# Patient Record
Sex: Female | Born: 1950
Health system: Southern US, Community
[De-identification: ages and names within clinical notes are randomized; demographics above are authoritative.]

## PROBLEM LIST (undated history)

## (undated) DIAGNOSIS — I4891 Unspecified atrial fibrillation: Secondary | ICD-10-CM

## (undated) DIAGNOSIS — I420 Dilated cardiomyopathy: Secondary | ICD-10-CM

## (undated) DIAGNOSIS — I4819 Other persistent atrial fibrillation: Secondary | ICD-10-CM

## (undated) DIAGNOSIS — Z87891 Personal history of nicotine dependence: Secondary | ICD-10-CM

## (undated) HISTORY — DX: Unspecified atrial fibrillation: I48.91

## (undated) HISTORY — DX: Other persistent atrial fibrillation: I48.19

## (undated) HISTORY — DX: Dilated cardiomyopathy: I42.0

---

## 2019-09-28 ENCOUNTER — Emergency Department (HOSPITAL_COMMUNITY): Payer: Medicare Other

## 2019-09-28 ENCOUNTER — Inpatient Hospital Stay (HOSPITAL_COMMUNITY)
Admission: EM | Admit: 2019-09-28 | Discharge: 2019-10-03 | DRG: 287 | Disposition: A | Discharge status: Home / Self Care | Payer: Medicare Other | Attending: Cardiology | Admitting: Cardiology

## 2019-09-28 ENCOUNTER — Encounter (HOSPITAL_COMMUNITY): Payer: Self-pay | Admitting: Emergency Medicine

## 2019-09-28 ENCOUNTER — Other Ambulatory Visit: Payer: Self-pay

## 2019-09-28 DIAGNOSIS — R7303 Prediabetes: Secondary | ICD-10-CM | POA: Diagnosis present

## 2019-09-28 DIAGNOSIS — Z79899 Other long term (current) drug therapy: Secondary | ICD-10-CM | POA: Diagnosis not present

## 2019-09-28 DIAGNOSIS — I5043 Acute on chronic combined systolic (congestive) and diastolic (congestive) heart failure: Secondary | ICD-10-CM | POA: Diagnosis present

## 2019-09-28 DIAGNOSIS — I4891 Unspecified atrial fibrillation: Secondary | ICD-10-CM | POA: Diagnosis present

## 2019-09-28 DIAGNOSIS — Z87891 Personal history of nicotine dependence: Secondary | ICD-10-CM

## 2019-09-28 DIAGNOSIS — Z20828 Contact with and (suspected) exposure to other viral communicable diseases: Secondary | ICD-10-CM | POA: Diagnosis present

## 2019-09-28 DIAGNOSIS — E876 Hypokalemia: Secondary | ICD-10-CM

## 2019-09-28 DIAGNOSIS — E785 Hyperlipidemia, unspecified: Secondary | ICD-10-CM | POA: Diagnosis not present

## 2019-09-28 DIAGNOSIS — I361 Nonrheumatic tricuspid (valve) insufficiency: Secondary | ICD-10-CM | POA: Diagnosis not present

## 2019-09-28 DIAGNOSIS — E669 Obesity, unspecified: Secondary | ICD-10-CM | POA: Diagnosis present

## 2019-09-28 DIAGNOSIS — R030 Elevated blood-pressure reading, without diagnosis of hypertension: Secondary | ICD-10-CM | POA: Diagnosis not present

## 2019-09-28 DIAGNOSIS — R17 Unspecified jaundice: Secondary | ICD-10-CM | POA: Diagnosis not present

## 2019-09-28 DIAGNOSIS — Z23 Encounter for immunization: Secondary | ICD-10-CM

## 2019-09-28 DIAGNOSIS — R739 Hyperglycemia, unspecified: Secondary | ICD-10-CM | POA: Diagnosis not present

## 2019-09-28 DIAGNOSIS — I340 Nonrheumatic mitral (valve) insufficiency: Secondary | ICD-10-CM | POA: Diagnosis not present

## 2019-09-28 DIAGNOSIS — Z6832 Body mass index (BMI) 32.0-32.9, adult: Secondary | ICD-10-CM | POA: Diagnosis not present

## 2019-09-28 DIAGNOSIS — I5021 Acute systolic (congestive) heart failure: Secondary | ICD-10-CM | POA: Diagnosis not present

## 2019-09-28 DIAGNOSIS — I428 Other cardiomyopathies: Secondary | ICD-10-CM | POA: Diagnosis present

## 2019-09-28 DIAGNOSIS — I5041 Acute combined systolic (congestive) and diastolic (congestive) heart failure: Secondary | ICD-10-CM | POA: Diagnosis not present

## 2019-09-28 DIAGNOSIS — I110 Hypertensive heart disease with heart failure: Principal | ICD-10-CM | POA: Diagnosis present

## 2019-09-28 DIAGNOSIS — I509 Heart failure, unspecified: Secondary | ICD-10-CM

## 2019-09-28 HISTORY — DX: Personal history of nicotine dependence: Z87.891

## 2019-09-28 LAB — CBC WITH DIFFERENTIAL/PLATELET
Abs Immature Granulocytes: 0.04 10*3/uL (ref 0.00–0.07)
Basophils Absolute: 0 10*3/uL (ref 0.0–0.1)
Basophils Relative: 1 %
Eosinophils Absolute: 0 10*3/uL (ref 0.0–0.5)
Eosinophils Relative: 1 %
HCT: 46.5 % — ABNORMAL HIGH (ref 36.0–46.0)
Hemoglobin: 14.8 g/dL (ref 12.0–15.0)
Immature Granulocytes: 1 %
Lymphocytes Relative: 27 %
Lymphs Abs: 1.8 10*3/uL (ref 0.7–4.0)
MCH: 31.5 pg (ref 26.0–34.0)
MCHC: 31.8 g/dL (ref 30.0–36.0)
MCV: 98.9 fL (ref 80.0–100.0)
Monocytes Absolute: 0.8 10*3/uL (ref 0.1–1.0)
Monocytes Relative: 11 %
Neutro Abs: 4.2 10*3/uL (ref 1.7–7.7)
Neutrophils Relative %: 59 %
Platelets: 226 10*3/uL (ref 150–400)
RBC: 4.7 MIL/uL (ref 3.87–5.11)
RDW: 13.6 % (ref 11.5–15.5)
WBC: 6.9 10*3/uL (ref 4.0–10.5)
nRBC: 0 % (ref 0.0–0.2)

## 2019-09-28 LAB — URINALYSIS, ROUTINE W REFLEX MICROSCOPIC
Bilirubin Urine: NEGATIVE
Glucose, UA: NEGATIVE mg/dL
Ketones, ur: 5 mg/dL — AB
Leukocytes,Ua: NEGATIVE
Nitrite: NEGATIVE
Protein, ur: 30 mg/dL — AB
Specific Gravity, Urine: 1.014 (ref 1.005–1.030)
pH: 5 (ref 5.0–8.0)

## 2019-09-28 LAB — COMPREHENSIVE METABOLIC PANEL
ALT: 40 U/L (ref 0–44)
ALT: 38 U/L (ref 0–44)
AST: 33 U/L (ref 15–41)
AST: 33 U/L (ref 15–41)
Albumin: 4.1 g/dL (ref 3.5–5.0)
Albumin: 3.6 g/dL (ref 3.5–5.0)
Alkaline Phosphatase: 77 U/L (ref 38–126)
Alkaline Phosphatase: 68 U/L (ref 38–126)
Anion gap: 15 (ref 5–15)
Anion gap: 11 (ref 5–15)
BUN: 14 mg/dL (ref 8–23)
BUN: 14 mg/dL (ref 8–23)
CO2: 24 mmol/L (ref 22–32)
CO2: 27 mmol/L (ref 22–32)
Calcium: 9.1 mg/dL (ref 8.9–10.3)
Calcium: 8.8 mg/dL — ABNORMAL LOW (ref 8.9–10.3)
Chloride: 102 mmol/L (ref 98–111)
Chloride: 103 mmol/L (ref 98–111)
Creatinine, Ser: 0.92 mg/dL (ref 0.44–1.00)
Creatinine, Ser: 0.87 mg/dL (ref 0.44–1.00)
GFR calc Af Amer: >60 mL/min (ref >60)
GFR calc Af Amer: >60 mL/min (ref >60)
GFR calc non Af Amer: >60 mL/min (ref >60)
GFR calc non Af Amer: >60 mL/min (ref >60)
Glucose, Bld: 148 mg/dL — ABNORMAL HIGH (ref 70–99)
Glucose, Bld: 125 mg/dL — ABNORMAL HIGH (ref 70–99)
Potassium: 3 mmol/L — ABNORMAL LOW (ref 3.5–5.1)
Potassium: 3.6 mmol/L (ref 3.5–5.1)
Sodium: 141 mmol/L (ref 135–145)
Sodium: 141 mmol/L (ref 135–145)
Total Bilirubin: 1.4 mg/dL — ABNORMAL HIGH (ref 0.3–1.2)
Total Bilirubin: 1.4 mg/dL — ABNORMAL HIGH (ref 0.3–1.2)
Total Protein: 7.2 g/dL (ref 6.5–8.1)
Total Protein: 5.9 g/dL — ABNORMAL LOW (ref 6.5–8.1)

## 2019-09-28 LAB — PROTIME-INR
INR: 1 (ref 0.8–1.2)
Prothrombin Time: 13.2 seconds (ref 11.4–15.2)

## 2019-09-28 LAB — TSH: TSH: 1.459 u[IU]/mL (ref 0.350–4.500)

## 2019-09-28 LAB — TROPONIN I (HIGH SENSITIVITY)
Troponin I (High Sensitivity): 36 ng/L — ABNORMAL HIGH (ref <18)
Troponin I (High Sensitivity): 36 ng/L — ABNORMAL HIGH (ref <18)

## 2019-09-28 LAB — APTT: aPTT: 24 seconds (ref 24–36)

## 2019-09-28 LAB — BRAIN NATRIURETIC PEPTIDE: B Natriuretic Peptide: 1325.1 pg/mL — ABNORMAL HIGH (ref 0.0–100.0)

## 2019-09-28 LAB — SARS CORONAVIRUS 2 BY RT PCR (HOSPITAL ORDER, PERFORMED IN ~~LOC~~ HOSPITAL LAB): SARS Coronavirus 2: NEGATIVE

## 2019-09-28 MED ORDER — HEPARIN BOLUS VIA INFUSION
4000.0000 [IU] | Freq: Once | INTRAVENOUS | Status: AC
  Administered 2019-09-28: 4000 [IU] via INTRAVENOUS
  Filled 2019-09-28: qty 4000

## 2019-09-28 MED ORDER — POTASSIUM CHLORIDE 10 MEQ/100ML IV SOLN
10.0000 meq | Freq: Once | INTRAVENOUS | Status: AC
  Administered 2019-09-28: 10 meq via INTRAVENOUS
  Filled 2019-09-28: qty 100

## 2019-09-28 MED ORDER — POTASSIUM CHLORIDE CRYS ER 20 MEQ PO TBCR
20.0000 meq | EXTENDED_RELEASE_TABLET | Freq: Once | ORAL | Status: AC
  Administered 2019-09-28: 20 meq via ORAL
  Filled 2019-09-28: qty 1

## 2019-09-28 MED ORDER — HEPARIN (PORCINE) 25000 UT/250ML-% IV SOLN
1100.0000 [IU]/h | INTRAVENOUS | Status: DC
  Administered 2019-09-28 – 2019-09-30 (×2): 1100 [IU]/h via INTRAVENOUS
  Filled 2019-09-28 (×3): qty 250

## 2019-09-28 MED ORDER — FUROSEMIDE 10 MG/ML IJ SOLN
40.0000 mg | Freq: Every day | INTRAMUSCULAR | Status: DC
  Administered 2019-09-29 – 2019-09-30 (×2): 40 mg via INTRAVENOUS
  Filled 2019-09-28 (×2): qty 4

## 2019-09-28 MED ORDER — CAPTOPRIL 12.5 MG PO TABS
6.2500 mg | ORAL_TABLET | Freq: Three times a day (TID) | ORAL | Status: DC
  Administered 2019-09-28 – 2019-09-30 (×5): 6.25 mg via ORAL
  Filled 2019-09-28 (×7): qty 0.5

## 2019-09-28 MED ORDER — POTASSIUM CHLORIDE CRYS ER 20 MEQ PO TBCR
20.0000 meq | EXTENDED_RELEASE_TABLET | Freq: Every day | ORAL | Status: DC
  Administered 2019-09-29 – 2019-09-30 (×2): 20 meq via ORAL
  Filled 2019-09-28 (×2): qty 1

## 2019-09-28 MED ORDER — CARVEDILOL 6.25 MG PO TABS
6.2500 mg | ORAL_TABLET | Freq: Two times a day (BID) | ORAL | Status: DC
  Administered 2019-09-28 – 2019-09-30 (×4): 6.25 mg via ORAL
  Filled 2019-09-28 (×5): qty 1

## 2019-09-28 MED ORDER — FUROSEMIDE 10 MG/ML IJ SOLN
40.0000 mg | Freq: Once | INTRAMUSCULAR | Status: AC
  Administered 2019-09-28: 40 mg via INTRAVENOUS
  Filled 2019-09-28: qty 4

## 2019-09-28 NOTE — ED Notes (Signed)
Pt. Documented in error see above note in chart. 

## 2019-09-28 NOTE — H&P (Addendum)
History and Physical    Evelyn Phelps VVK:[id number] DOB: [birth year] DOA: 09/28/2019  PCP: Patient, No Pcp Per  Patient coming from: Home  I have personally briefly reviewed patient's old medical records in [facility]  Chief Complaint: Progressive shortness of breath  HPI: Evelyn Phelps is a 68 y.o. female with unknown medical history who presented with concerns of progressive shortness of breath.  Patient has not seen a primary care physician in many years.  She reports that for the past 2 weeks she has noticed gradual worsening shortness of breath worse with exertion and now to the point that she is dyspneic even when speaking.  She also noted increasing edema of her ankle and feet prompting her to present to the ED.  She denies any orthopnea or paroxysmal nocturnal dyspnea although she would get up to use the bathroom at night and would have trouble returning back to sleep due to her breathing.  She has been sleeping on a recliner but only secondary to her husband being a Paramedic and having questionable COVID contact.  She endorses tightness of her chest for the past week that she reports feels similar to her past pneumonias.  Denies any palpitation.  Denies any nausea, vomiting or diarrhea. She is a former smoker but quit in 2018.  Has occasional alcoholic drinks.  Denies any illicit drugs.  Denies any family history of any heart diseases.  ED Course: She had mildly elevated temperature of 99 and was hypertensive up to 180s over 80 on room air.  CBC showed no leukocytosis or anemia.  CMP showed low potassium of 3 and elevated glucose of 148.  Elevated total bilirubin of 1.4.  BNP of 1325.  Troponin of 36.  Chest x-ray showed cardiomegaly with edema.  There is also a very small bilateral pleural effusion.  Review of Systems: Constitutional: No Weight Change, No Fever ENT/Mouth: No sore throat, No Rhinorrhea Eyes: No Eye Pain, No Vision Changes Cardiovascular: + Chest Pain, + SOB, No  PND, No Dyspnea on Exertion, No Orthopnea + Edema, No Palpitations Respiratory: No Cough, No Sputum, No Wheezing, + Dyspnea  Gastrointestinal: No Nausea, No Vomiting, No Diarrhea, No Constipation, No Pain Genitourinary: no Urinary Incontinence, No Urgency, No Flank Pain Musculoskeletal: No Arthralgias, No Myalgias Skin: No Skin Lesions, No Pruritus, Neuro: no Weakness, No Numbness,  No Loss of Consciousness, No Syncope Psych: No Anxiety/Panic, No Depression, no decrease appetite Heme/Lymph: No Bruising, No Bleeding   History reviewed. No pertinent past medical history.  History reviewed. No pertinent surgical history.   reports that she has quit smoking. She has never used smokeless tobacco. She reports previous alcohol use. No history on file for drug.  Allergies  Allergen Reactions  . Erythromycin Rash    No family history on file. : Family history reviewed and not pertinent   Prior to Admission medications   Medication Sig Start Date End Date Taking? Authorizing Provider  ibuprofen (ADVIL) 200 MG tablet Take 200 mg by mouth daily as needed for moderate pain.   Yes [provider]    Physical Exam: Vitals:   09/28/19 1930 09/28/19 2000 09/28/19 2045 09/28/19 2100  BP: (!) 159/91 (!) 165/79  (!) 174/99  Pulse: 85 85    Resp:      Temp:      TempSrc:      SpO2: 95% 94% 95%   Weight:      Height:        Constitutional:  NAD, calm, comfortable, laying at 40 degree incline in bed Vitals:   09/28/19 1930 09/28/19 2000 09/28/19 2045 09/28/19 2100  BP: (!) 159/91 (!) 165/79  (!) 174/99  Pulse: 85 85    Resp:      Temp:      TempSrc:      SpO2: 95% 94% 95%   Weight:      Height:       Eyes: PERRL, lids and conjunctivae normal ENMT: Mucous membranes are moist.  Neck: normal, supple, no masses Respiratory: clear to auscultation bilaterally, no wheezing, no crackles. No accessory muscle use. Mild dyspnea with conversation. Cardiovascular: Regular rate and  rhythm, no murmurs / rubs / gallops. +3 distal pre-tibial and ankle edema of the lower extremities. 2+ pedal pulses.  Abdomen: no tenderness, no masses palpated. Bowel sounds positive.  GU: purwick catheter in place Musculoskeletal: no clubbing / cyanosis. No joint deformity upper and lower extremities. Good ROM, no contractures. Normal muscle tone.  Skin: no rashes, lesions, ulcers. No induration Neurologic: CN 2-12 grossly intact. Sensation intact.. Strength 5/5 in all 4.  Psychiatric: Normal judgment and insight. Alert and oriented x 3. Normal mood.     Labs on Admission: I have personally reviewed following labs and imaging studies  CBC: Recent Labs  Lab 09/28/19 1823  WBC 6.9  NEUTROABS 4.2  HGB 14.8  HCT 46.5*  MCV 98.9  PLT 675   Basic Metabolic Panel: Recent Labs  Lab 09/28/19 1823  NA 141  K 3.0*  CL 102  CO2 24  GLUCOSE 148*  BUN 14  CREATININE 0.92  CALCIUM 9.1   GFR: Estimated Creatinine Clearance: 65.9 mL/min (by C-G formula based on SCr of 0.92 mg/dL). Liver Function Tests: Recent Labs  Lab 09/28/19 1823  AST 33  ALT 40  ALKPHOS 77  BILITOT 1.4*  PROT 7.2  ALBUMIN 4.1   No results for input(s): LIPASE, AMYLASE in the last 168 hours. No results for input(s): AMMONIA in the last 168 hours. Coagulation Profile: No results for input(s): INR, PROTIME in the last 168 hours. Cardiac Enzymes: No results for input(s): CKTOTAL, CKMB, CKMBINDEX, TROPONINI in the last 168 hours. BNP (last 3 results) No results for input(s): PROBNP in the last 8760 hours. HbA1C: No results for input(s): HGBA1C in the last 72 hours. CBG: No results for input(s): GLUCAP in the last 168 hours. Lipid Profile: No results for input(s): CHOL, HDL, LDLCALC, TRIG, CHOLHDL, LDLDIRECT in the last 72 hours. Thyroid Function Tests: No results for input(s): TSH, T4TOTAL, FREET4, T3FREE, THYROIDAB in the last 72 hours. Anemia Panel: No results for input(s): VITAMINB12, FOLATE,  FERRITIN, TIBC, IRON, RETICCTPCT in the last 72 hours. Urine analysis:    Component Value Date/Time   COLORURINE YELLOW 09/28/2019 1916   APPEARANCEUR CLEAR 09/28/2019 1916   LABSPEC 1.014 09/28/2019 1916   PHURINE 5.0 09/28/2019 1916   GLUCOSEU NEGATIVE 09/28/2019 1916   HGBUR SMALL (A) 09/28/2019 [place] NEGATIVE 09/28/2019 1916   KETONESUR 5 (A) 09/28/2019 1916   PROTEINUR 30 (A) 09/28/2019 1916   NITRITE NEGATIVE 09/28/2019 1916   LEUKOCYTESUR NEGATIVE 09/28/2019 1916    Radiological Exams on Admission: Dg Chest 2 View  Result Date: 09/28/2019 CLINICAL DATA:  Increasing shortness of breath over the past 2 years, worse over the past week. EXAM: CHEST - 2 VIEW COMPARISON:  None. FINDINGS: There is cardiomegaly without edema. Very small bilateral pleural effusions and mild basilar atelectasis are seen. No pneumothorax. IMPRESSION: Cardiomegaly without  edema. Very small bilateral pleural effusions and mild basilar atelectasis. Electronically Signed   By: Drusilla Kanner M.D.   On: 09/28/2019 13:59    EKG: Independently reviewed.   Assessment/Plan   Acute Congestive heart failure -potentially acute on  chronic as she has been without medical care for many years -BNP of 1325 on admission.  Chest x-ray shows cardiomegaly and small bilateral pleural effusion. - Given IV 40mg  Lasix in the ED. Will continue 40 mg IV daily -Monitor I's and O's - Daily weights - echocardiogram - started on Coreg and captopril by EDP per cardiology recommendation. Discussed further with Dr. to start beta-blocker as pt is not decompensated.  - cardiology to evaluate in the morning  New onset atrial fibrillation with RVR - EKG showed atrial fibrillation with RVR with ventricular conduction delay - rate controlled around 110 on telemetry. PRN IV Metoprolol if rate becomes uncontrolled. - Likely triggered by acute CHF exacerbation -Continue monitor on telemetry -Started on Coreg  6.25 per cardiology - CH2AD2Vas score of 3 (age, gender, CHF) - start on Heparin gtt. Discussed Eliquis with patient and she will discuss further with cardiology in the morning - check TSH  Elevated troponin - likely secondary to demand ischemia from CHF and A.fib  - will trend x 2 to evaluate for any significant rise  Hypokalemia - K of 3 on admission - started on oral [place] and IV K x 2 in the ED - will continue oral K daily with daily diuresis  Hyperglycemia - 148 on admission - check HbA1C especially since she has been without medical care for many years  Hypertension - started on Captopril 6.25mg  and Coreg  - monitor BP   Elevated bilirubin -Mildly elevated at 1.4 admission -Most likely secondary to vascular congestion -Monitor with CMP in the morning    DVT prophylaxis: Heparin Code Status:Full Family Communication: Plan discussed with patient at bedside  disposition Plan: Home with at least 2 midnight stays  Consults called: Cardiology Admission status: inpatient  Amayiah Gosnell T Glory Graefe DO [facility]   If 7PM-7AM, please contact night-coverage [email or web address] Password [email or web address]  09/28/2019, 9:15 PM

## 2019-09-28 NOTE — ED Provider Notes (Signed)
[facility]-EMERGENCY DEPT Provider Note   CSN: [id number] Arrival date & time: 09/28/19  1254     History   Chief Complaint Chief Complaint  Patient presents with   Shortness of Breath   leg swelling    HPI Evelyn Phelps is a 68 y.o. female.     HPI   Evelyn Phelps is a 68 y.o. female, patient with no pertinent past medical history, presenting to the ED with shortness of breath with exertion worsening over the past 2 weeks. Intermittent, fleeting chest tightness.  She states the intensity of her shortness of breath has increased and the amount of activity required to cause shortness of breath has decreased. Began to have lower extremity edema about a week ago, first in the left ankle and foot and then in the right. She states she has had intermittent lower extremity edema in the past, but it typically resolves with elevation of the legs.  She has been sleeping on the sofa since her husband is a Paramedic and may have had cases of COVID among his coworkers.  She previously had a PCP, but he retired, and she has not seen a regular medical provider in about 10 years.  She did say, however, she had pneumonia 2 years ago and has had difficulty with premature fatigue since then.  Denies fever/chills, cough, N/V/C/D, abdominal pain, dizziness, syncope, urinary symptoms, sustained chest pain, back pain, or any other complaints.    History reviewed. No pertinent past medical history.  Patient Active Problem List   Diagnosis Date Noted   Acute CHF (congestive heart failure) (HCC) 09/28/2019    History reviewed. No pertinent surgical history.   OB History   No obstetric history on file.      Home Medications    Prior to Admission medications   Medication Sig Start Date End Date Taking? Authorizing Provider  ibuprofen (ADVIL) 200 MG tablet Take 200 mg by mouth daily as needed for moderate pain.   Yes [provider]    Family  History No family history on file.  Social History Social History   Tobacco Use   Smoking status: Former Smoker   Smokeless tobacco: Never Used  Substance Use Topics   Alcohol use: Not Currently   Drug use: Not on file     Allergies   Erythromycin   Review of Systems Review of Systems  Constitutional: Negative for chills, diaphoresis and fever.  Respiratory: Positive for chest tightness and shortness of breath. Negative for cough.   Cardiovascular: Positive for leg swelling. Negative for chest pain.  Gastrointestinal: Negative for abdominal pain, blood in stool, constipation, diarrhea, nausea and vomiting.  Genitourinary: Negative for dysuria, flank pain and hematuria.  Musculoskeletal: Negative for back pain.  Neurological: Negative for dizziness, syncope, light-headedness and numbness.  All other systems reviewed and are negative.    Physical Exam Updated Vital Signs BP (!) 184/83    Pulse (!) 115    Temp 99 F (37.2 C) (Oral)    Resp 20    Ht 5\' 6"  (1.676 m)    Wt 94.6 kg    SpO2 98%    BMI 33.65 kg/m   Physical Exam Vitals signs and nursing note reviewed.  Constitutional:      General: She is not in acute distress.    Appearance: She is well-developed. She is not diaphoretic.  HENT:     Head: Normocephalic and atraumatic.     Mouth/Throat:  Mouth: Mucous membranes are moist.     Pharynx: Oropharynx is clear.  Eyes:     Conjunctiva/sclera: Conjunctivae normal.  Neck:     Musculoskeletal: Neck supple.  Cardiovascular:     Rate and Rhythm: Tachycardia present. Rhythm irregularly irregular.     Pulses: Normal pulses.          Radial pulses are 2+ on the right side and 2+ on the left side.       Posterior tibial pulses are 2+ on the right side and 2+ on the left side.     Heart sounds: Normal heart sounds.     Comments: Tactile temperature in the extremities appropriate and equal bilaterally. Patient initially mildly tachycardic. Pulmonary:      Effort: Pulmonary effort is normal. No respiratory distress.     Breath sounds: Normal breath sounds.     Comments: Increased work of breathing and voices increased difficulty breathing with small amounts of exertion, such as putting on socks. Abdominal:     Palpations: Abdomen is soft.     Tenderness: There is no abdominal tenderness. There is no guarding.  Musculoskeletal:     Right lower leg: Edema present.     Left lower leg: Edema present.     Comments: Bilateral LE pitting edema. No tenderness, increased warmth, or color change.   Lymphadenopathy:     Cervical: No cervical adenopathy.  Skin:    General: Skin is warm and dry.  Neurological:     Mental Status: She is alert.  Psychiatric:        Mood and Affect: Mood and affect normal.        Speech: Speech normal.        Behavior: Behavior normal.      ED Treatments / Results  Labs (all labs ordered are listed, but only abnormal results are displayed) Labs Reviewed  COMPREHENSIVE METABOLIC PANEL - Abnormal; Notable for the following components:      Result Value   Potassium 3.0 (*)    Glucose, Bld 148 (*)    Total Bilirubin 1.4 (*)    All other components within normal limits  URINALYSIS, ROUTINE W REFLEX MICROSCOPIC - Abnormal; Notable for the following components:   Hgb urine dipstick SMALL (*)    Ketones, ur 5 (*)    Protein, ur 30 (*)    Bacteria, UA RARE (*)    All other components within normal limits  CBC WITH DIFFERENTIAL/PLATELET - Abnormal; Notable for the following components:   HCT 46.5 (*)    All other components within normal limits  BRAIN NATRIURETIC PEPTIDE - Abnormal; Notable for the following components:   B Natriuretic Peptide 1,325.1 (*)    All other components within normal limits  TROPONIN I (HIGH SENSITIVITY) - Abnormal; Notable for the following components:   Troponin I (High Sensitivity) 36 (*)    All other components within normal limits  SARS CORONAVIRUS 2 BY RT PCR (HOSPITAL ORDER,  PERFORMED IN [facility])  PROTIME-INR  APTT  CBC  HIV ANTIBODY (ROUTINE TESTING W REFLEX)  HIV4GL SAVE TUBE  HEMOGLOBIN A1C  COMPREHENSIVE METABOLIC PANEL  TSH  TROPONIN I (HIGH SENSITIVITY)    EKG EKG Interpretation  Date/Time:  Sunday September 28 2019 13:09:58 EDT Ventricular Rate:  118 PR Interval:    QRS Duration: 154 QT Interval:  342 QTC Calculation: 479 R Axis:   -99 Text Interpretation:  Atrial fibrillation with rapid ventricular response with premature ventricular or aberrantly conducted  complexes Right superior axis deviation Non-specific intra-ventricular conduction block Abnormal ECG No prior for comparison.  Confirmed by Alona Bene [phone]) on 09/28/2019 7:12:23 PM   Radiology Dg Chest 2 View  Result Date: 09/28/2019 CLINICAL DATA:  Increasing shortness of breath over the past 2 years, worse over the past week. EXAM: CHEST - 2 VIEW COMPARISON:  None. FINDINGS: There is cardiomegaly without edema. Very small bilateral pleural effusions and mild basilar atelectasis are seen. No pneumothorax. IMPRESSION: Cardiomegaly without edema. Very small bilateral pleural effusions and mild basilar atelectasis. Electronically Signed   By: Drusilla Kanner M.D.   On: 09/28/2019 13:59    Procedures .Critical Care Performed by: Anselm Pancoast, PA-C Authorized by: Anselm Pancoast, PA-C   Critical care provider statement:    Critical care time (minutes):  35   Critical care was necessary to treat or prevent imminent or life-threatening deterioration of the following conditions:  Cardiac failure   Critical care was time spent personally by me on the following activities:  Development of treatment plan with patient or surrogate, discussions with consultants, evaluation of patient's response to treatment, examination of patient, obtaining history from patient or surrogate, ordering and performing treatments and interventions, ordering and review of laboratory studies, ordering  and review of radiographic studies, pulse oximetry and re-evaluation of patient's condition   I assumed direction of critical care for this patient from another provider in my specialty: no     (including critical care time)  Medications Ordered in ED Medications  potassium chloride 10 mEq in 100 mL IVPB (has no administration in time range)    Followed by  potassium chloride 10 mEq in 100 mL IVPB (has no administration in time range)  furosemide (LASIX) injection 40 mg (has no administration in time range)  carvedilol (COREG) tablet 6.25 mg (6.25 mg Oral Given 09/28/19 2129)  captopril (CAPOTEN) tablet 6.25 mg (has no administration in time range)  heparin bolus via infusion 4,000 Units (has no administration in time range)  heparin ADULT infusion 100 units/mL (25000 units/232mL sodium chloride 0.45%) (has no administration in time range)  furosemide (LASIX) injection 40 mg (has no administration in time range)  potassium chloride SA (KLOR-CON) CR tablet 20 mEq (has no administration in time range)  potassium chloride SA (KLOR-CON) CR tablet 20 mEq (20 mEq Oral Given 09/28/19 2116)   This patients CHA2DS2-VASc Score and unadjusted Ischemic Stroke Rate (% per year) is equal to 4.8 % stroke rate/year from a score of 4  Above score calculated as 1 point each if present [CHF, HTN, DM, Vascular=MI/PAD/Aortic Plaque, Age if 65-74, or Female] Above score calculated as 2 points each if present [Age > 75, or Stroke/TIA/TE]   Initial Impression / Assessment and Plan / ED Course  I have reviewed the triage vital signs and the nursing notes.  Pertinent labs & imaging results that were available during my care of the patient were reviewed by me and considered in my medical decision making (see chart for details).  Clinical Course as of Sep 27 2138  Wynelle Link Sep 28, 2019  2016 Patient has no additional symptoms.  We discussed lab findings and plan for admission.  Patient agrees to the plan. Although  the monitor is reading tachycardic pulse rate for the patient, her manual pulse was noted to be 90, irregularly irregular.   [SJ]  2032 Spoke with Dr. Okey Dupre, cardiology fellow.  Agrees with overall picture suggesting new onset/exacerbation CHF and EKG suggesting A. Fib. Agrees with  starting heparin for the patient's A. Fib and the 40 mg Lasix.  We discussed the hypokalemia and proposed supplementation. Recommends starting carvedilol 6.25 mg twice daily and captopril 6.25 mg every 8 hours. He states she can get an echo here in the morning.   [SJ]  2040 Spoke with Dr. Flossie Buffy, hospitalist. Agrees to admit the patient.   [SJ]    Clinical Course User Index [SJ] Maleiyah Releford C, PA-C       Patient presents with progressively worsening shortness of breath as well as lower extremity edema.  Overall picture suggests possible new onset CHF with fluid overload. Her pulse rate was mildly tachycardic, but improved with rest.  Noted to be in A. fib.  Hypertensive, but low suspicion for hypertensive emergency. BNP elevated.  Suspect mildly elevated troponin is due to demand from fluid overload. Hypokalemia noted and addressed. Patient admitted to hospitalist with cardiology consulting.  Findings and plan of care discussed with Gara Kroner, MD. Dr. Laverta Baltimore personally evaluated and examined this patient.  Vitals:   09/28/19 2045 09/28/19 2100 09/28/19 2127 09/28/19 2128  BP:  (!) 174/99 (!) 161/91   Pulse:   (!) 106   Resp:    (!) 22  Temp:      TempSrc:      SpO2: 95%  92%   Weight:      Height:         Final Clinical Impressions(s) / ED Diagnoses   Final diagnoses:  New onset of congestive heart failure [facility])    ED Discharge Orders    None       Layla Maw 09/28/19 2139    Margette Fast, MD 09/29/19 1409

## 2019-09-28 NOTE — Progress Notes (Signed)
ANTICOAGULATION CONSULT NOTE - Initial Consult  Pharmacy Consult for heparin Indication: atrial fibrillation  Allergies  Allergen Reactions  . Erythromycin Rash    Patient Measurements: Height: 5\' 6"  (167.6 cm) Weight: 208 lb 8 oz (94.6 kg) IBW/kg (Calculated) : 59.3 Heparin Dosing Weight: 80 kg  Vital Signs: Temp: 99 F (37.2 C) (10/11 1309) Temp Source: Oral (10/11 1309) BP: 165/79 (10/11 2000) Pulse Rate: 85 (10/11 2000)  Labs: Recent Labs    09/28/19 1823  HGB 14.8  HCT 46.5*  PLT 226  CREATININE 0.92  TROPONINIHS 36*    Estimated Creatinine Clearance: 65.9 mL/min (by C-G formula based on SCr of 0.92 mg/dL).   Medical History: History reviewed. No pertinent past medical history.  Assessment: Pharmacy consulted to dose/monitor heparin drip for this 68 year old female for atrial fibrillation. Pt not on anticoagulants PTA, does not follow with any PCP.   Baseline labs:  -Hgb 14.8 -Plt 226  -aPTT and protime/INR ordered  Goal of Therapy:  Heparin level 0.3-0.7 units/ml Monitor platelets by anticoagulation protocol: Yes   Plan:  Give 4000 units bolus x 1 Start heparin infusion at 1100 units/hr Check anti-Xa level in 8 hours and daily while on heparin Continue to monitor H&H and platelets  Lenis Noon, PharmD 09/28/2019,8:52 PM

## 2019-09-28 NOTE — ED Notes (Signed)
Pt given warm blanket while waiting in lobby for an available bed. Pt asking how many doctors we have here because she has seen several EMS come and go and if she would have called an ambulance would she have been seen faster? This RN informed pt that just because people come by ambulance doesn't guarantee you a bed, it depends on severity of patient's symptoms.  Pt then states that "well how much longer is it going to be because when I checked in they told me at the desk it would be an hour before I get in the back and my husband is in the car and has to work tonight and I don't know what to tell him". Informed patient that I can't tell her how much longer it will be before she gets a room in the back, as we are holding several admission patients that are tying up a lot of our rooms and until we are able to get them out, we don't have rooms available.

## 2019-09-28 NOTE — ED Triage Notes (Signed)
Pt c/o SOB since last week esp with walking in grocery store. And since Friday having bilat feet swelling

## 2019-09-29 ENCOUNTER — Inpatient Hospital Stay (HOSPITAL_COMMUNITY): Payer: Medicare Other

## 2019-09-29 DIAGNOSIS — I361 Nonrheumatic tricuspid (valve) insufficiency: Secondary | ICD-10-CM

## 2019-09-29 DIAGNOSIS — I340 Nonrheumatic mitral (valve) insufficiency: Secondary | ICD-10-CM

## 2019-09-29 LAB — BASIC METABOLIC PANEL
Anion gap: 16 — ABNORMAL HIGH (ref 5–15)
BUN: 13 mg/dL (ref 8–23)
CO2: 22 mmol/L (ref 22–32)
Calcium: 8.6 mg/dL — ABNORMAL LOW (ref 8.9–10.3)
Chloride: 103 mmol/L (ref 98–111)
Creatinine, Ser: 0.88 mg/dL (ref 0.44–1.00)
GFR calc Af Amer: >60 mL/min (ref >60)
GFR calc non Af Amer: >60 mL/min (ref >60)
Glucose, Bld: 122 mg/dL — ABNORMAL HIGH (ref 70–99)
Potassium: 3.4 mmol/L — ABNORMAL LOW (ref 3.5–5.1)
Sodium: 141 mmol/L (ref 135–145)

## 2019-09-29 LAB — CBC
HCT: 39.4 % (ref 36.0–46.0)
Hemoglobin: 12.6 g/dL (ref 12.0–15.0)
MCH: 32 pg (ref 26.0–34.0)
MCHC: 32 g/dL (ref 30.0–36.0)
MCV: 100 fL (ref 80.0–100.0)
Platelets: 179 10*3/uL (ref 150–400)
RBC: 3.94 MIL/uL (ref 3.87–5.11)
RDW: 13.6 % (ref 11.5–15.5)
WBC: 6.3 10*3/uL (ref 4.0–10.5)
nRBC: 0 % (ref 0.0–0.2)

## 2019-09-29 LAB — TROPONIN I (HIGH SENSITIVITY): Troponin I (High Sensitivity): 30 ng/L — ABNORMAL HIGH (ref <18)

## 2019-09-29 LAB — ECHOCARDIOGRAM COMPLETE
Height: 66 in
Weight: 3262.81 oz

## 2019-09-29 LAB — GLUCOSE, CAPILLARY
Glucose-Capillary: 132 mg/dL — ABNORMAL HIGH (ref 70–99)
Glucose-Capillary: 114 mg/dL — ABNORMAL HIGH (ref 70–99)

## 2019-09-29 LAB — HEMOGLOBIN A1C
Hgb A1c MFr Bld: 6 % — ABNORMAL HIGH (ref 4.8–5.6)
Mean Plasma Glucose: 125.5 mg/dL

## 2019-09-29 LAB — HEPARIN LEVEL (UNFRACTIONATED)
Heparin Unfractionated: 0.55 IU/mL (ref 0.30–0.70)
Heparin Unfractionated: 0.61 IU/mL (ref 0.30–0.70)

## 2019-09-29 MED ORDER — INSULIN ASPART 100 UNIT/ML ~~LOC~~ SOLN
0.0000 [IU] | Freq: Three times a day (TID) | SUBCUTANEOUS | Status: DC
  Administered 2019-09-29 – 2019-10-03 (×4): 1 [IU] via SUBCUTANEOUS

## 2019-09-29 MED ORDER — INFLUENZA VAC A&B SA ADJ QUAD 0.5 ML IM PRSY
0.5000 mL | PREFILLED_SYRINGE | INTRAMUSCULAR | Status: AC
  Administered 2019-09-30: 0.5 mL via INTRAMUSCULAR
  Filled 2019-09-29: qty 0.5

## 2019-09-29 NOTE — Progress Notes (Signed)
PROGRESS NOTE  MENDE BISWELL FWY:[id number] DOB: April 11, 1951 DOA: 09/28/2019 PCP: Patient, No Pcp Per  HPI/Recap of past 24 hours: Evelyn Phelps is a 68 y.o. female with unknown medical history who presented with concerns of progressive shortness of breath.  Patient has not seen a primary care physician in many years.  She reports that for the past 2 weeks she has noticed gradual worsening shortness of breath worse with exertion and now to the point that she is dyspneic even when speaking.  She also noted increasing edema of her ankle and feet prompting her to present to the ED.  She denies any orthopnea or paroxysmal nocturnal dyspnea although she would get up to use the bathroom at night and would have trouble returning back to sleep due to her breathing.  She has been sleeping on a recliner but only secondary to her husband being a Tour manager and having questionable COVID contact.  She endorses tightness of her chest for the past week that she reports feels similar to her past pneumonias.  Denies any palpitation.  Denies any nausea, vomiting or diarrhea. She is a former smoker but quit in 2018.  Has occasional alcoholic drinks.  Denies any illicit drugs.  Denies any family history of any heart diseases.  ED Course: She had mildly elevated temperature of 99 and was hypertensive up to 180s over 80 on room air.  CBC showed no leukocytosis or anemia.  CMP showed low potassium of 3 and elevated glucose of 148.  Elevated total bilirubin of 1.4.  BNP of 1325.  Troponin of 36.  Chest x-ray showed cardiomegaly with edema.  There is also a very small bilateral pleural effusion.  09/29/19: Patient was seen and examined at her bedside this morning.  She states she still has trouble laying down flat without feeling short of breath.  Denies any chest pain or palpitations.  Denies family history of heart disease.   Assessment/Plan: Active Problems:   Acute CHF (congestive heart failure) ([place])  Newly  diagnosed acute systolic CHF Presented with bilateral lower extremity edema, conversational dyspnea, PND, BNP greater than 1300, mildly elevated troponin, and chest x-ray showing cardiomegaly with pulmonary edema and bilateral pleural effusions. 2D echo done on 10/07/2019 showed LVEF 25 to 30% with severely decreased function of left ventricle and severely increased left ventricular size.  A kinesis of the anteroseptal, apical and inferior walls. Started on IV Lasix with KCl replacement, Coreg and captopril. Cardiology following Continue strict I's and O's and daily weight Net I&O -2.9 L Start daily BMPs and closely monitor blood pressure, electrolytes, urine output, and renal function while on IV diuretics  Newly diagnosed A. fib with RVR Presented with A. fib RVR TSH normal Started on Coreg with improvement of rate Chads Vascor 3, on heparin drip Continue heparin drip if procedure planned Defer to cardiology to switch to NOAC  Elevated troponin S likely demand ischemia from A. fib RVR versus acute systolic CHF Troponin S peaked at 36 and trended down No anginal symptoms  Impaired glucose tolerance with hyperglycemia Hemoglobin A1c 6.0 on 09/29/2019 Start insulin sliding scale  Obesity BMI 32 Recommend weight loss outpatient with regular physical activity and healthy dieting when stable.    DVT prophylaxis: Heparin drip Code Status:Full Family Communication: None at bedside.     Disposition Plan: The patient is currently not appropriate for discharge at this time due to ongoing work-up for newly diagnosed systolic CHF and newly diagnosed A. fib.  Consults called: Cardiology      Objective: Vitals:   09/29/19 0030 09/29/19 0100 09/29/19 0145 09/29/19 0655  BP: 135/77 (!) 143/78 (!) 145/90   Pulse:  78 87   Resp: (!) 28 (!) 26 18   Temp:   98.3 F (36.8 C)   TempSrc:   Oral   SpO2:  92% 95%   Weight:    92.5 kg  Height:        Intake/Output Summary (Last 24  hours) at 09/29/2019 1329 Last data filed at 09/29/2019 1105 Gross per 24 hour  Intake 296.28 ml  Output 3250 ml  Net -2953.72 ml   Filed Weights   09/28/19 1313 09/29/19 0655  Weight: 94.6 kg 92.5 kg    Exam:  . General: 68 y.o. year-old female well developed well nourished in no acute distress.  Alert and oriented x4. . Cardiovascular: Irregular rate and rhythm with no rubs or gallops.  No thyromegaly or JVD noted.   Marland Kitchen Respiratory: Clear to auscultation with no wheezes or rales. Good inspiratory effort. . Abdomen: Soft nontender nondistended with normal bowel sounds x4 quadrants. . Musculoskeletal: Trace lower extremity edema. 2/4 pulses in all 4 extremities. Marland Kitchen Psychiatry: Mood is appropriate for condition and setting   Data Reviewed: CBC: Recent Labs  Lab 09/28/19 1823 09/29/19 0516  WBC 6.9 6.3  NEUTROABS 4.2  --   HGB 14.8 12.6  HCT 46.5* 39.4  MCV 98.9 100.0  PLT 226 179   Basic Metabolic Panel: Recent Labs  Lab 09/28/19 1823 09/28/19 2131 09/29/19 0650  NA 141 141 141  K 3.0* 3.6 3.4*  CL 102 103 103  CO2 24 27 22   GLUCOSE 148* 125* 122*  BUN 14 14 13   CREATININE 0.92 0.87 0.88  CALCIUM 9.1 8.8* 8.6*   GFR: Estimated Creatinine Clearance: 68.2 mL/min (by C-G formula based on SCr of 0.88 mg/dL). Liver Function Tests: Recent Labs  Lab 09/28/19 1823 09/28/19 2131  AST 33 33  ALT 40 38  ALKPHOS 77 68  BILITOT 1.4* 1.4*  PROT 7.2 5.9*  ALBUMIN 4.1 3.6   No results for input(s): LIPASE, AMYLASE in the last 168 hours. No results for input(s): AMMONIA in the last 168 hours. Coagulation Profile: Recent Labs  Lab 09/28/19 2106  INR 1.0   Cardiac Enzymes: No results for input(s): CKTOTAL, CKMB, CKMBINDEX, TROPONINI in the last 168 hours. BNP (last 3 results) No results for input(s): PROBNP in the last 8760 hours. HbA1C: Recent Labs    09/29/19 0209  HGBA1C 6.0*   CBG: No results for input(s): GLUCAP in the last 168 hours. Lipid Profile:  No results for input(s): CHOL, HDL, LDLCALC, TRIG, CHOLHDL, LDLDIRECT in the last 72 hours. Thyroid Function Tests: Recent Labs    09/28/19 2132  TSH 1.459   Anemia Panel: No results for input(s): VITAMINB12, FOLATE, FERRITIN, TIBC, IRON, RETICCTPCT in the last 72 hours. Urine analysis:    Component Value Date/Time   COLORURINE YELLOW 09/28/2019 1916   APPEARANCEUR CLEAR 09/28/2019 1916   LABSPEC 1.014 09/28/2019 1916   PHURINE 5.0 09/28/2019 1916   GLUCOSEU NEGATIVE 09/28/2019 1916   HGBUR SMALL (A) 09/28/2019 1916   BILIRUBINUR NEGATIVE 09/28/2019 1916   KETONESUR 5 (A) 09/28/2019 1916   PROTEINUR 30 (A) 09/28/2019 1916   NITRITE NEGATIVE 09/28/2019 1916   LEUKOCYTESUR NEGATIVE 09/28/2019 1916   Sepsis Labs: @LABRCNTIP (procalcitonin:4,lacticidven:4)  ) Recent Results (from the past 240 hour(s))  SARS Coronavirus 2 by RT PCR (hospital order, performed  [facility]) Nasopharyngeal Nasopharyngeal Swab     Status: None   Collection Time: 09/28/19  7:40 PM   Specimen: Nasopharyngeal Swab  Result Value Ref Range Status   SARS Coronavirus 2 NEGATIVE NEGATIVE Final    Comment: (NOTE) If result is NEGATIVE SARS-CoV-2 target nucleic acids are NOT DETECTED. The SARS-CoV-2 RNA is generally detectable in upper and lower  respiratory specimens during the acute phase of infection. The lowest  concentration of SARS-CoV-2 viral copies this assay can detect is 250  copies / mL. A negative result does not preclude SARS-CoV-2 infection  and should not be used as the sole basis for treatment or other  patient management decisions.  A negative result may occur with  improper specimen collection / handling, submission of specimen other  than nasopharyngeal swab, presence of viral mutation(s) within the  areas targeted by this assay, and inadequate number of viral copies  (<250 copies / mL). A negative result must be combined with clinical  observations, patient history, and  epidemiological information. If result is POSITIVE SARS-CoV-2 target nucleic acids are DETECTED. The SARS-CoV-2 RNA is generally detectable in upper and lower  respiratory specimens dur ing the acute phase of infection.  Positive  results are indicative of active infection with SARS-CoV-2.  Clinical  correlation with patient history and other diagnostic information is  necessary to determine patient infection status.  Positive results do  not rule out bacterial infection or co-infection with other viruses. If result is PRESUMPTIVE POSTIVE SARS-CoV-2 nucleic acids MAY BE PRESENT.   A presumptive positive result was obtained on the submitted specimen  and confirmed on repeat testing.  While 2019 novel coronavirus  (SARS-CoV-2) nucleic acids may be present in the submitted sample  additional confirmatory testing may be necessary for epidemiological  and / or clinical management purposes  to differentiate between  SARS-CoV-2 and other Sarbecovirus currently known to infect humans.  If clinically indicated additional testing with an alternate test  methodology [phone]) is advised. The SARS-CoV-2 RNA is generally  detectable in upper and lower respiratory sp ecimens during the acute  phase of infection. The expected result is Negative. Fact Sheet for Patients:  BoilerBrush.com.cyhttps://www.fda.gov/media/136312/download Fact Sheet for Healthcare Providers: https://pope.com/https://www.fda.gov/media/136313/download This test is not yet approved or cleared by the [place] FDA and has been authorized for detection and/or diagnosis of SARS-CoV-2 by FDA under an Emergency Use Authorization (EUA).  This EUA will remain in effect (meaning this test can be used) for the duration of the COVID-19 declaration under Section 564(b)(1) of the Act, 21 U.S.C. section 360bbb-3(b)(1), unless the authorization is terminated or revoked sooner. Performed at [facility], [address] [place], [place] [address]        Studies: Dg Chest 2 View  Result Date: 09/28/2019 CLINICAL DATA:  Increasing shortness of breath over the past 2 years, worse over the past week. EXAM: CHEST - 2 VIEW COMPARISON:  None. FINDINGS: There is cardiomegaly without edema. Very small bilateral pleural effusions and mild basilar atelectasis are seen. No pneumothorax. IMPRESSION: Cardiomegaly without edema. Very small bilateral pleural effusions and mild basilar atelectasis. Electronically Signed   By: Drusilla Kannerhomas  Dalessio M.D.   On: 09/28/2019 13:59    Scheduled Meds: . captopril  6.25 mg Oral Q8H  . carvedilol  6.25 mg Oral BID WC  . furosemide  40 mg Intravenous Daily  . [START ON 09/30/2019] influenza vaccine adjuvanted  0.5 mL Intramuscular Tomorrow-1000  . potassium chloride  20  mEq Oral Daily    Continuous Infusions: . heparin 1,100 Units/hr (09/29/19 0153)     LOS: 1 day     Darlin Drop, MD [facility] Pager [phone]  If 7PM-7AM, please contact night-coverage [email or web address] Password [email or web address] 09/29/2019, 1:29 PM

## 2019-09-29 NOTE — Progress Notes (Addendum)
ANTICOAGULATION CONSULT NOTE - Initial Consult  Pharmacy Consult for heparin Indication: atrial fibrillation  Allergies  Allergen Reactions  . Erythromycin Rash    Patient Measurements: Height: 5\' 6"  (167.6 cm) Weight: 203 lb 14.8 oz (92.5 kg) IBW/kg (Calculated) : 59.3 Heparin Dosing Weight: 80 kg  Vital Signs: Temp: 98.3 F (36.8 C) (10/12 0145) Temp Source: Oral (10/12 0145) BP: 145/90 (10/12 0145) Pulse Rate: 87 (10/12 0145)  Labs: Recent Labs    09/28/19 1823 09/28/19 1950 09/28/19 2106 09/28/19 2131 09/29/19 0209 09/29/19 0516 09/29/19 0650  HGB 14.8  --   --   --   --  12.6  --   HCT 46.5*  --   --   --   --  39.4  --   PLT 226  --   --   --   --  179  --   APTT  --   --  24  --   --   --   --   LABPROT  --   --  13.2  --   --   --   --   INR  --   --  1.0  --   --   --   --   HEPARINUNFRC  --   --   --   --   --  0.55  --   CREATININE 0.92  --   --  0.87  --   --  0.88  TROPONINIHS 36* 36*  --   --  30*  --   --     Estimated Creatinine Clearance: 68.2 mL/min (by C-G formula based on SCr of 0.88 mg/dL).   Medical History: History reviewed. No pertinent past medical history.  Assessment: Pharmacy consulted to dose/monitor heparin drip for this 68 year old female for atrial fibrillation. Pt not on anticoagulants PTA, does not follow with any PCP.   Today, 09/29/2019  Heparin level 0.55, WNL  CBC: Hgb and Plts WNL (note plts dropped from 226 to 179 in 12 hours since starting heparin). Potentially a dilution effect, time frame does not match for Type II HIT. Could be transient decrease (Type I HIT). Will f/u levels  No bleeding or infusion related issues reported by nurse.  Goal of Therapy:  Heparin level 0.3-0.7 units/ml Monitor platelets by anticoagulation protocol: Yes   Plan:  Continue heparin infusion rate of 1100 units/hr Check heparin level at 1400 (confirmatory) Monitor daily CBC and heparin level while on heparin Monitor for s/sx of  bleeding F/u transition to [place], PharmD Candidate 09/29/2019,8:05 AM

## 2019-09-29 NOTE — Progress Notes (Signed)
  Echocardiogram 2D Echocardiogram has been performed.  Jannett Celestine 09/29/2019, 9:47 AM

## 2019-09-30 ENCOUNTER — Encounter (HOSPITAL_COMMUNITY): Payer: Self-pay

## 2019-09-30 DIAGNOSIS — I5041 Acute combined systolic (congestive) and diastolic (congestive) heart failure: Secondary | ICD-10-CM

## 2019-09-30 DIAGNOSIS — I4891 Unspecified atrial fibrillation: Secondary | ICD-10-CM

## 2019-09-30 LAB — MAGNESIUM: Magnesium: 1.8 mg/dL (ref 1.7–2.4)

## 2019-09-30 LAB — BASIC METABOLIC PANEL
Anion gap: 12 (ref 5–15)
BUN: 21 mg/dL (ref 8–23)
CO2: 29 mmol/L (ref 22–32)
Calcium: 8.4 mg/dL — ABNORMAL LOW (ref 8.9–10.3)
Chloride: 101 mmol/L (ref 98–111)
Creatinine, Ser: 0.86 mg/dL (ref 0.44–1.00)
GFR calc Af Amer: >60 mL/min (ref >60)
GFR calc non Af Amer: >60 mL/min (ref >60)
Glucose, Bld: 103 mg/dL — ABNORMAL HIGH (ref 70–99)
Potassium: 3.2 mmol/L — ABNORMAL LOW (ref 3.5–5.1)
Sodium: 142 mmol/L (ref 135–145)

## 2019-09-30 LAB — HEPARIN LEVEL (UNFRACTIONATED): Heparin Unfractionated: 0.45 IU/mL (ref 0.30–0.70)

## 2019-09-30 LAB — HIV ANTIBODY (ROUTINE TESTING W REFLEX): HIV Screen 4th Generation wRfx: NONREACTIVE

## 2019-09-30 LAB — GLUCOSE, CAPILLARY
Glucose-Capillary: 108 mg/dL — ABNORMAL HIGH (ref 70–99)
Glucose-Capillary: 111 mg/dL — ABNORMAL HIGH (ref 70–99)
Glucose-Capillary: 125 mg/dL — ABNORMAL HIGH (ref 70–99)
Glucose-Capillary: 104 mg/dL — ABNORMAL HIGH (ref 70–99)

## 2019-09-30 LAB — CBC
HCT: 42.5 % (ref 36.0–46.0)
Hemoglobin: 13.6 g/dL (ref 12.0–15.0)
MCH: 32.1 pg (ref 26.0–34.0)
MCHC: 32 g/dL (ref 30.0–36.0)
MCV: 100.2 fL — ABNORMAL HIGH (ref 80.0–100.0)
Platelets: 184 10*3/uL (ref 150–400)
RBC: 4.24 MIL/uL (ref 3.87–5.11)
RDW: 13.8 % (ref 11.5–15.5)
WBC: 5.7 10*3/uL (ref 4.0–10.5)
nRBC: 0 % (ref 0.0–0.2)

## 2019-09-30 LAB — LIPID PANEL
Cholesterol: 172 mg/dL (ref 0–200)
HDL: 42 mg/dL (ref >40)
LDL Cholesterol: 113 mg/dL — ABNORMAL HIGH (ref 0–99)
Total CHOL/HDL Ratio: 4.1 RATIO
Triglycerides: 83 mg/dL (ref <150)
VLDL: 17 mg/dL (ref 0–40)

## 2019-09-30 LAB — POTASSIUM: Potassium: 3.9 mmol/L (ref 3.5–5.1)

## 2019-09-30 MED ORDER — CARVEDILOL 12.5 MG PO TABS
12.5000 mg | ORAL_TABLET | Freq: Two times a day (BID) | ORAL | Status: DC
  Administered 2019-09-30 – 2019-10-03 (×5): 12.5 mg via ORAL
  Filled 2019-09-30 (×5): qty 1

## 2019-09-30 MED ORDER — FUROSEMIDE 40 MG PO TABS: 40.0000 mg | ORAL_TABLET | Freq: Every day | ORAL | Status: DC

## 2019-09-30 MED ORDER — POTASSIUM CHLORIDE CRYS ER 20 MEQ PO TBCR
40.0000 meq | EXTENDED_RELEASE_TABLET | Freq: Two times a day (BID) | ORAL | Status: AC
  Administered 2019-09-30 (×2): 40 meq via ORAL
  Filled 2019-09-30 (×2): qty 2

## 2019-09-30 NOTE — Plan of Care (Signed)
  Problem: Nutrition: Goal: Adequate nutrition will be maintained Outcome: Progressing   Problem: Pain Managment: Goal: General experience of comfort will improve Outcome: Progressing   Problem: Safety: Goal: Ability to remain free from injury will improve Outcome: Progressing   

## 2019-09-30 NOTE — Consult Note (Signed)
Cardiology Consultation:   Patient ID: Evelyn Phelps; [id number]; [birth year]   Admit date: 09/28/2019 Date of Consult: 09/30/2019  Primary Care Provider: Patient, No Pcp Per Primary Cardiologist: [facility]; Dr. Cristal Deer Primary Electrophysiologist:  None   Patient Profile:   Evelyn Phelps is a 68 y.o. female who has not been seen by a medical provider in quite some time, former tobacco abuse (quit in 2018), who is being seen today for the evaluation of acute combined CHF and atrial fibrillation at the request of Dr. Margo Aye.  History of Present Illness:   Evelyn Phelps was in her usual state of health until a few weeks ago when she began experiencing DOE. She would notice SOB while gardening or walking. She reported bendopnea. Also occasional chest tightness which she stated felt like her prior episode of PNA 2 years ago, with decreased exercise tolerance since that time. She reported occasional palpitations which last for seconds to minutes. She experienced LE edema. Her SOB progressively worsened prompting her to present to the ED for further evaluation.   She does not following with medical providers regularly. No prior cardiac testing. She denies family history of heart disease. She reports drinking etoh socially. She quit smoking 2 years ago after her episode of PNA. She denies recreational drug use. She reports being told her blood pressure was elevated on several dental appointments in recent years but no formal HTN diagnosis. No history of HLD, though she blames her father's dementia on statin use and is very hesitant to consider statin therapy if her cholesterol is elevated on labs this admission.   At the time of this evaluation she reports improvement in her breathing and LE edema. Still with some orthopnea but no PND. No complaints of dizziness, lightheadedness, or syncope. Prior to a few weeks ago she denied any anginal complaints. She denies recent viral illnesses. No  fever, cough, nausea, vomiting, diarrhea.   Hospital course: tachycardic to 110s on admission, improved to 80s, persistently hypertensive, otherwise VSS. Labs notable for hypokalemia (3.2 today), Cr 0.92>0.86, CBC wnl, BNP 1325, Trop 36>30, Hgb A1C 6.0, TSH wnl. EKG with coarse atrial fibrillation with rate 117, PVC, non-specific IVCD, no comparison available. CXR with cardiomegaly without edema. Echo with EF 25-30%, mild LVH, severely dilated LV, normal RV size/function, normal LA size, mild MR/TR, and mildly elevated PA pressures. She was started on carvedilol for rate control/CHF and captopril per Cardiology fellow recommendations. Heparin gtt started for stroke ppx. She was diuresed with IV lasix 40mg  daily with UOP net -1.9L in the past 24 hours and -2.8L this admission. Cardiology asked to evaluate for new onset CHF and Afib.    Past Medical History:  Diagnosis Date  . Former tobacco use    Quit in 2018    History reviewed. No pertinent surgical history.   Home Medications:  Prior to Admission medications   Medication Sig Start Date End Date Taking? Authorizing Provider  ibuprofen (ADVIL) 200 MG tablet Take 200 mg by mouth daily as needed for moderate pain.   Yes [provider]    Inpatient Medications: Scheduled Meds: . captopril  6.25 mg Oral Q8H  . carvedilol  6.25 mg Oral BID WC  . furosemide  40 mg Intravenous Daily  . insulin aspart  0-9 Units Subcutaneous TID WC  . potassium chloride  40 mEq Oral BID   Continuous Infusions: . heparin 1,100 Units/hr (09/30/19 0555)   PRN Meds:   Allergies:  Allergies  Allergen Reactions  . Erythromycin Rash    Social History:   Social History   Socioeconomic History  . Marital status: Married    Spouse name: Not on file  . Number of children: Not on file  . Years of education: Not on file  . Highest education level: Not on file  Occupational History  . Not on file  Social Needs  . Financial resource strain:  Not on file  . Food insecurity    Worry: Not on file    Inability: Not on file  . Transportation needs    Medical: Not on file    Non-medical: Not on file  Tobacco Use  . Smoking status: Former Research scientist (life sciences)  . Smokeless tobacco: Never Used  Substance and Sexual Activity  . Alcohol use: Not Currently  . Drug use: Not on file  . Sexual activity: Not on file  Lifestyle  . Physical activity    Days per week: Not on file    Minutes per session: Not on file  . Stress: Not on file  Relationships  . Social Herbalist on phone: Not on file    Gets together: Not on file    Attends religious service: Not on file    Active member of club or organization: Not on file    Attends meetings of clubs or organizations: Not on file    Relationship status: Not on file  . Intimate partner violence    Fear of current or ex partner: Not on file    Emotionally abused: Not on file    Physically abused: Not on file    Forced sexual activity: Not on file  Other Topics Concern  . Not on file  Social History Narrative  . Not on file    Family History:    Family History  Problem Relation Age of Onset  . Thyroid disease Mother   . Dementia Father   . High Cholesterol Father      ROS:  Please see the history of present illness.   All other ROS reviewed and negative.     Physical Exam/Data:   Vitals:   09/29/19 2248 09/30/19 0500 09/30/19 0558 09/30/19 0851  BP: (!) 114/57  (!) 142/77 137/77  Pulse: 77  72 86  Resp: 20     Temp:   97.7 F (36.5 C)   TempSrc:   Oral   SpO2:   93%   Weight:  88.7 kg    Height:        Intake/Output Summary (Last 24 hours) at 09/30/2019 1506 Last data filed at 09/30/2019 0900 Gross per 24 hour  Intake 1204.75 ml  Output 250 ml  Net 954.75 ml   Filed Weights   09/28/19 1313 09/29/19 0655 09/30/19 0500  Weight: 94.6 kg 92.5 kg 88.7 kg   Body mass index is 31.57 kg/m.  General:  Well nourished, well developed, sitting upright in bed in no  acute distress HEENT: sclera anicteric  Neck: no JVD Vascular: No carotid bruits; distal pulses 2+ bilaterally Cardiac:  normal S1, S2; IRIR; no murmurs, rubs, or galloops Lungs:  clear to auscultation bilaterally, no wheezing, rhonchi or rales  Abd: NABS, soft, nontender, no hepatomegaly Ext: 1+ LE edema Musculoskeletal:  No deformities, BUE and BLE strength normal and equal Skin: warm and dry  Neuro:  CNs 2-12 intact, no focal abnormalities noted Psych:  Normal affect   EKG:  The EKG was personally reviewed and demonstrates:  Coarse atrial fibrillation with rate 117, PVC, non-specific IVCD, no comparison available. Telemetry:  Telemetry was personally reviewed and demonstrates:  Coarse atrial fibrillation vs atrial flutter with variable AV block with rate primarily in the 80s  Relevant CV Studies: Echocardiogram 09/29/2019: 1. Left ventricular ejection fraction, by visual estimation, is 25 to 30%. The left ventricle has severely decreased function. Severely increased left ventricular size. There is mildly increased left ventricular hypertrophy.  2. Left ventricular diastolic function could not be evaluated pattern of LV diastolic filling.  3. Global right ventricle has normal systolic function.The right ventricular size is normal.  4. Left atrial size was normal.  5. Right atrial size was normal.  6. Mild mitral annular calcification.  7. The mitral valve is abnormal. Mild mitral valve regurgitation. No evidence of mitral stenosis.  8. The tricuspid valve is normal in structure. Tricuspid valve regurgitation is mild.  9. The aortic valve is tricuspid Aortic valve regurgitation is trivial by color flow Doppler. Mild aortic valve sclerosis without stenosis. 10. The pulmonic valve was not well visualized. Pulmonic valve regurgitation is not visualized by color flow Doppler. 11. Mildly elevated pulmonary artery systolic pressure. 12. The inferior vena cava is dilated in size with <50%  respiratory variability, suggesting right atrial pressure of 15 mmHg. 13. Akinesis of the anteroseptal, apical and inferior walls; overall severely reduced LV systolic function; severe LVE; mild LVH; trace AI; mild MR and TR.  Laboratory Data:  Chemistry Recent Labs  Lab 09/28/19 2131 09/29/19 0650 09/30/19 0521  NA 141 141 142  K 3.6 3.4* 3.2*  CL 103 103 101  CO2 27 22 29   GLUCOSE 125* 122* 103*  BUN 14 13 21   CREATININE 0.87 0.88 0.86  CALCIUM 8.8* 8.6* 8.4*  GFRNONAA >60 >60 >60  GFRAA >60 >60 >60  ANIONGAP 11 16* 12    Recent Labs  Lab 09/28/19 1823 09/28/19 2131  PROT 7.2 5.9*  ALBUMIN 4.1 3.6  AST 33 33  ALT 40 38  ALKPHOS 77 68  BILITOT 1.4* 1.4*   Hematology Recent Labs  Lab 09/28/19 1823 09/29/19 0516 09/30/19 0521  WBC 6.9 6.3 5.7  RBC 4.70 3.94 4.24  HGB 14.8 12.6 13.6  HCT 46.5* 39.4 42.5  MCV 98.9 100.0 100.2*  MCH 31.5 32.0 32.1  MCHC 31.8 32.0 32.0  RDW 13.6 13.6 13.8  PLT 226 179 184   Cardiac EnzymesNo results for input(s): TROPONINI in the last 168 hours. No results for input(s): TROPIPOC in the last 168 hours.  BNP Recent Labs  Lab 09/28/19 1828  BNP 1,325.1*    DDimer No results for input(s): DDIMER in the last 168 hours.  Radiology/Studies:  Dg Chest 2 View  Result Date: 09/28/2019 CLINICAL DATA:  Increasing shortness of breath over the past 2 years, worse over the past week. EXAM: CHEST - 2 VIEW COMPARISON:  None. FINDINGS: There is cardiomegaly without edema. Very small bilateral pleural effusions and mild basilar atelectasis are seen. No pneumothorax. IMPRESSION: Cardiomegaly without edema. Very small bilateral pleural effusions and mild basilar atelectasis. Electronically Signed   By: Drusilla Kanner M.D.   On: 09/28/2019 13:59    Assessment and Plan:   1. Acute combined CHF: patient presented with progressive DOE. Occasional chest tightness. BNP 1325. EKG with atrial fibrillation with RVR with non-specific IVCD. CXR with  cardiomegaly but no edema. Echo with EF 25-30%. She was started on IV lasix 40mg  daily with UOP net -1.9L in the past 24 hours and -2.8L  this admission. Weight 208lbs>195lbs. Symptoms improved but still with mild LE edema and orthopnea. Possible this is tachycardia driven 2/2 atrial fibrillation with RVR on presentation but will likely need to rule out ischemic etiology. Risk factors for CAD include HTN, pre-DM, and former tobacco use.  - Will plan for LHC tomorrow to evaluate for CAD. The patient understands that risks included but are not limited to stroke (1 in 1000), death (1 in 1000), kidney failure [usually temporary] (1 in 500), bleeding (1 in 200), allergic reaction [possibly serious] (1 in 200).  The patient understands and agrees to proceed.  - Will check a lipid panel for risk stratification - Will increase carvedilol to 12.5mg BID - Will stop captopril at this time with plans to start entresto following catheterization - Consider adding spironolactone if room in BP after addition of entresto.  - Continue IV lasix - anticipate transition to po in the next 24-48 hours. - Continue to monitor strict I&Os and daily weights - Anticipate repeat echocardiogram in 3 months to monitor for improvement in EF. If EF remains low, may need to refer to EP for ICD consideration  2. New onset atrial fibrillation with RVR: HR up to 110s on arrival. HR improved after starting carvedilol, though she still remains in atrial fibrillation. TSH wnl. She is unaware of her current atrial fibrillation.  - Continue carvedilol for rate control.  - This patients CHA2DS2-VASc Score and unadjusted Ischemic Stroke Rate (% per year) is equal to (at least) 4.8 % stroke rate/year from a score of 4 Above score calculated as 1 point each if present [CHF, HTN, DM, Vascular=MI/PAD/Aortic Plaque, Age if 65-74, or Female] Above score calculated as 2 points each if present [Age > 75, or Stroke/TIA/TE] - Continue heparin gtt for now  - plan to transition to eliquis once clear no further cardiac testing needed - Will consider TEE/DCCV after cardiac catheterization if she remains in atrial fibrillation   3. HTN: patient reports elevated blood pressures at dental appointments for several years. Not on any medications prior to this admission.  - Managed in the context of #1 and 2  4. Pre-DM: Hgb A1C 6.0 this admission. - Continue to encourage healthy dietary/lifestyle modifications to promote weight loss.   5. Mildly elevated troponin: trop trend 36>30. Not consistent with ACS. EKG with non-specific IVCD and afib with RVR with rate 117. No history of heart disease. No prior ischemic evaluation. Likely this is demand ischemia in the setting of #1 and 2.  - Ischemic evaluation per #1  For questions or updates, please contact [facility] Please consult [email or web address] for contact info under Cardiology/STEMI.   Signed, Cristianna Cyr M. Airik Goodlin, PA-C  09/30/2019 3:06 PM [phone]  

## 2019-09-30 NOTE — Progress Notes (Signed)
[place] for heparin Indication: atrial fibrillation  Allergies  Allergen Reactions  . Erythromycin Rash    Patient Measurements: Height: 5\' 6"  (167.6 cm) Weight: 195 lb 9.6 oz (88.7 kg) IBW/kg (Calculated) : 59.3 Heparin Dosing Weight: 80 kg  Vital Signs: Temp: 97.7 F (36.5 C) (10/13 0558) Temp Source: Oral (10/13 0558) BP: 142/77 (10/13 0558) Pulse Rate: 72 (10/13 0558)  Labs: Recent Labs    09/28/19 1823 09/28/19 1950 09/28/19 2106 09/28/19 2131 09/29/19 0209 09/29/19 0516 09/29/19 0650 09/29/19 1416 09/30/19 0521  HGB 14.8  --   --   --   --  12.6  --   --  13.6  HCT 46.5*  --   --   --   --  39.4  --   --  42.5  PLT 226  --   --   --   --  179  --   --  184  APTT  --   --  24  --   --   --   --   --   --   LABPROT  --   --  13.2  --   --   --   --   --   --   INR  --   --  1.0  --   --   --   --   --   --   HEPARINUNFRC  --   --   --   --   --  0.55  --  0.61 0.45  CREATININE 0.92  --   --  0.87  --   --  0.88  --  0.86  TROPONINIHS 36* 36*  --   --  30*  --   --   --   --     Estimated Creatinine Clearance: 68.3 mL/min (by C-G formula based on SCr of 0.86 mg/dL).   Medical History: History reviewed. No pertinent past medical history.  Assessment: Pharmacy consulted to dose/monitor heparin drip for this 68 year old female for atrial fibrillation. Pt not on anticoagulants PTA, does not follow with any PCP.   Today, 09/30/2019  Heparin level 0.45, WNL  CBC: Hgb and Plts WNL  No bleeding or infusion related issues reported by nurse.  Goal of Therapy:  Heparin level 0.3-0.7 units/ml Monitor platelets by anticoagulation protocol: Yes   Plan:  Continue heparin infusion rate of 1100 units/hr Monitor daily CBC and heparin level Monitor for s/sx of bleeding F/u transition to [place], PharmD Candidate 09/30/2019,7:37 AM

## 2019-09-30 NOTE — Progress Notes (Signed)
PROGRESS NOTE    Evelyn Phelps  DXA:[id number]  DOB: 1951/06/14  DOA: 09/28/2019 PCP: Patient, No Pcp Per  Brief Narrative:  68 y.o.femalewithunknown medical history who has not seen a PCP for many years,  presented with concerns of  Increasing pedal edema and progressive shortness of breath for the past 2 weeks.She did report chest tightness but no orthopnea or paroxysmal nocturnal dyspnea although she reported exertional dyspnea with trouble returning back to sleep after using the bathroom. She had been sleeping on a recliner but only secondary to her husband being a Tour manager and having questionable COVIDcontact.Denied any palpitations. ED Course:She had mildly elevated temperature of 99 and was hypertensive up to 180s over 80 on room air. CBC showed no leukocytosis or anemia. CMP showed low potassium of 3.0 and elevated glucose of 148. Elevated total bilirubin of 1.4. BNP of 1325. Troponin of 36 initially-trended down.Chest x-ray showed cardiomegaly with edema with small bilateral pleural effusions. EKGshowed atrial fibrillation with RVR with ventricular conduction delay. Hospital course: Patient received IV diuretics, b-blockers, cardiology consulted and admitted to Hospitalist service. CH2AD2Vas score of 3 (age, gender, CHF) - start on Heparin gtt. Discussed transition to Eliquis with patient and now BP better on Coreg/Captopril.2D echo done on 10/12 showing mild LVH and systolic cardiomyopathy with EF 25-30%.    Subjective:  Patient feels much improved.  Saturating well on room air and asking if she could go home.  Denies any chest pain.  Leg swelling is resolved.  Objective: Vitals:   09/29/19 2205 09/29/19 2248 09/30/19 0500 09/30/19 0558  BP: 115/62 (!) 114/57  (!) 142/77  Pulse: 87 77  72  Resp: 18 20    Temp:    97.7 F (36.5 C)  TempSrc:    Oral  SpO2: 99%   93%  Weight:   88.7 kg   Height:        Intake/Output Summary (Last 24 hours) at 09/30/2019 0851  Last data filed at 09/30/2019 0600 Gross per 24 hour  Intake 1244.75 ml  Output 3200 ml  Net -1955.25 ml   Filed Weights   09/28/19 1313 09/29/19 0655 09/30/19 0500  Weight: 94.6 kg 92.5 kg 88.7 kg    Physical Examination:  General exam: Appears calm and comfortable  Respiratory system: Clear to auscultation. Respiratory effort normal. Cardiovascular system: S1 & S2 heard, RRR. No JVD, murmurs, rubs, gallops or clicks. No pedal edema. Gastrointestinal system: Abdomen is nondistended, soft and nontender. No organomegaly or masses felt. Normal bowel sounds heard. Central nervous system: Alert and oriented. No focal neurological deficits. Extremities: Symmetric 5 x 5 power. Skin: No rashes, lesions or ulcers Psychiatry: Judgement and insight appear normal. Mood & affect appropriate.     Data Reviewed: I have personally reviewed following labs and imaging studies  CBC: Recent Labs  Lab 09/28/19 1823 09/29/19 0516 09/30/19 0521  WBC 6.9 6.3 5.7  NEUTROABS 4.2  --   --   HGB 14.8 12.6 13.6  HCT 46.5* 39.4 42.5  MCV 98.9 100.0 100.2*  PLT 226 179 209   Basic Metabolic Panel: Recent Labs  Lab 09/28/19 1823 09/28/19 2131 09/29/19 0650 09/30/19 0521  NA 141 141 141 142  K 3.0* 3.6 3.4* 3.2*  CL 102 103 103 101  CO2 24 27 22 29   GLUCOSE 148* 125* 122* 103*  BUN 14 14 13 21   CREATININE 0.92 0.87 0.88 0.86  CALCIUM 9.1 8.8* 8.6* 8.4*  MG  --   --   --  1.8   GFR: Estimated Creatinine Clearance: 68.3 mL/min (by C-G formula based on SCr of 0.86 mg/dL). Liver Function Tests: Recent Labs  Lab 09/28/19 1823 09/28/19 2131  AST 33 33  ALT 40 38  ALKPHOS 77 68  BILITOT 1.4* 1.4*  PROT 7.2 5.9*  ALBUMIN 4.1 3.6   No results for input(s): LIPASE, AMYLASE in the last 168 hours. No results for input(s): AMMONIA in the last 168 hours. Coagulation Profile: Recent Labs  Lab 09/28/19 2106  INR 1.0   Cardiac Enzymes: No results for input(s): CKTOTAL, CKMB, CKMBINDEX,  TROPONINI in the last 168 hours. BNP (last 3 results) No results for input(s): PROBNP in the last 8760 hours. HbA1C: Recent Labs    09/29/19 0209  HGBA1C 6.0*   CBG: Recent Labs  Lab 09/29/19 1632 09/29/19 2158 09/30/19 0732  GLUCAP 132* 114* 108*   Lipid Profile: No results for input(s): CHOL, HDL, LDLCALC, TRIG, CHOLHDL, LDLDIRECT in the last 72 hours. Thyroid Function Tests: Recent Labs    09/28/19 2132  TSH 1.459   Anemia Panel: No results for input(s): VITAMINB12, FOLATE, FERRITIN, TIBC, IRON, RETICCTPCT in the last 72 hours. Sepsis Labs: No results for input(s): PROCALCITON, LATICACIDVEN in the last 168 hours.  Recent Results (from the past 240 hour(s))  SARS Coronavirus 2 by RT PCR (hospital order, performed in [facility] hospital lab) Nasopharyngeal Nasopharyngeal Swab     Status: None   Collection Time: 09/28/19  7:40 PM   Specimen: Nasopharyngeal Swab  Result Value Ref Range Status   SARS Coronavirus 2 NEGATIVE NEGATIVE Final    Comment: (NOTE) If result is NEGATIVE SARS-CoV-2 target nucleic acids are NOT DETECTED. The SARS-CoV-2 RNA is generally detectable in upper and lower  respiratory specimens during the acute phase of infection. The lowest  concentration of SARS-CoV-2 viral copies this assay can detect is 250  copies / mL. A negative result does not preclude SARS-CoV-2 infection  and should not be used as the sole basis for treatment or other  patient management decisions.  A negative result may occur with  improper specimen collection / handling, submission of specimen other  than nasopharyngeal swab, presence of viral mutation(s) within the  areas targeted by this assay, and inadequate number of viral copies  (<250 copies / mL). A negative result must be combined with clinical  observations, patient history, and epidemiological information. If result is POSITIVE SARS-CoV-2 target nucleic acids are DETECTED. The SARS-CoV-2 RNA is generally  detectable in upper and lower  respiratory specimens dur ing the acute phase of infection.  Positive  results are indicative of active infection with SARS-CoV-2.  Clinical  correlation with patient history and other diagnostic information is  necessary to determine patient infection status.  Positive results do  not rule out bacterial infection or co-infection with other viruses. If result is PRESUMPTIVE POSTIVE SARS-CoV-2 nucleic acids MAY BE PRESENT.   A presumptive positive result was obtained on the submitted specimen  and confirmed on repeat testing.  While 2019 novel coronavirus  (SARS-CoV-2) nucleic acids may be present in the submitted sample  additional confirmatory testing may be necessary for epidemiological  and / or clinical management purposes  to differentiate between  SARS-CoV-2 and other Sarbecovirus currently known to infect humans.  If clinically indicated additional testing with an alternate test  methodology [phone]) is advised. The SARS-CoV-2 RNA is generally  detectable in upper and lower respiratory sp ecimens during the acute  phase of infection. The expected result is  Negative. Fact Sheet for Patients:  BoilerBrush.com.cy Fact Sheet for Healthcare Providers: https://pope.com/ This test is not yet approved or cleared by the [place] FDA and has been authorized for detection and/or diagnosis of SARS-CoV-2 by FDA under an Emergency Use Authorization (EUA).  This EUA will remain in effect (meaning this test can be used) for the duration of the COVID-19 declaration under Section 564(b)(1) of the Act, 21 U.S.C. section 360bbb-3(b)(1), unless the authorization is terminated or revoked sooner. Performed at [facility], [address], [place], [place] [address]       Radiology Studies: Dg Chest 2 View  Result Date: 09/28/2019 CLINICAL DATA:  Increasing shortness of breath over the past  2 years, worse over the past week. EXAM: CHEST - 2 VIEW COMPARISON:  None. FINDINGS: There is cardiomegaly without edema. Very small bilateral pleural effusions and mild basilar atelectasis are seen. No pneumothorax. IMPRESSION: Cardiomegaly without edema. Very small bilateral pleural effusions and mild basilar atelectasis. Electronically Signed   By: Drusilla Kanner M.D.   On: 09/28/2019 13:59        Scheduled Meds: . captopril  6.25 mg Oral Q8H  . carvedilol  6.25 mg Oral BID WC  . furosemide  40 mg Intravenous Daily  . influenza vaccine adjuvanted  0.5 mL Intramuscular Tomorrow-1000  . insulin aspart  0-9 Units Subcutaneous TID WC  . potassium chloride  20 mEq Oral Daily   Continuous Infusions: . heparin 1,100 Units/hr (09/30/19 0555)    Assessment & Plan:    1.  Acute systolic CHF exacerbation with newly diagnosed systolic cardiomyopathy: Echocardiogram as mentioned above shows significantly reduced EF 25 to 30%.  Patient had elevated troponin on presentation with peak at 36, albeit in the setting of rapid A. fib.  Seen by cardiology who recommends cardiac cath to rule out ischemic etiology for cardiomyopathy.  Patient been on IV diuresis and appears euvolemic now.  Will transition to oral.  Continue Coreg/captopril.  Appreciate cardiology evaluation and follow-up.  2.  New onset A. fib with RVR: Currently rate controlled on Coreg.  Remains on heparin drip which can be transitioned to NOAC after cardiac procedures completed.  Cardiology may consider DC cardioversion if remains in A. fib post cath.  3.  Mild hyperglycemia: Hemoglobin A1c at 6.  On sliding scale insulin.  Diabetic diet.  4.  Hypokalemia: In the setting of IV diuretics.  Continue potassium replacement as needed and transition diuretics to oral.    DVT prophylaxis: On heparin drip Code Status: Full code Family / Patient Communication: Discussed with patient Disposition Plan: Home when cleared by cardiology      LOS: 2 days    Time spent: 35 minutes    Alessandra Bevels, MD [facility] Pager [phone]  If 7PM-7AM, please contact night-coverage [email or web address] Password [email or web address] 09/30/2019, 8:51 AM

## 2019-09-30 NOTE — H&P (View-Only) (Signed)
Cardiology Consultation:   Patient ID: [name]; [id number]; [birth year]   Admit date: 09/28/2019 Date of Consult: 09/30/2019  Primary Care Provider: Patient, No Pcp Per Primary Cardiologist: [facility]; Dr. Cristal Deer Primary Electrophysiologist:  None   Patient Profile:   Evelyn Phelps is a 68 y.o. female who has not been seen by a medical provider in quite some time, former tobacco abuse (quit in 2018), who is being seen today for the evaluation of acute combined CHF and atrial fibrillation at the request of Dr. Margo Aye.  History of Present Illness:   [name] was in her usual state of health until a few weeks ago when she began experiencing DOE. She would notice SOB while gardening or walking. She reported bendopnea. Also occasional chest tightness which she stated felt like her prior episode of PNA 2 years ago, with decreased exercise tolerance since that time. She reported occasional palpitations which last for seconds to minutes. She experienced LE edema. Her SOB progressively worsened prompting her to present to the ED for further evaluation.   She does not following with medical providers regularly. No prior cardiac testing. She denies family history of heart disease. She reports drinking etoh socially. She quit smoking 2 years ago after her episode of PNA. She denies recreational drug use. She reports being told her blood pressure was elevated on several dental appointments in recent years but no formal HTN diagnosis. No history of HLD, though she blames her father's dementia on statin use and is very hesitant to consider statin therapy if her cholesterol is elevated on labs this admission.   At the time of this evaluation she reports improvement in her breathing and LE edema. Still with some orthopnea but no PND. No complaints of dizziness, lightheadedness, or syncope. Prior to a few weeks ago she denied any anginal complaints. She denies recent viral illnesses. No  fever, cough, nausea, vomiting, diarrhea.   Hospital course: tachycardic to 110s on admission, improved to 80s, persistently hypertensive, otherwise VSS. Labs notable for hypokalemia (3.2 today), Cr 0.92>0.86, CBC wnl, BNP 1325, Trop 36>30, Hgb A1C 6.0, TSH wnl. EKG with coarse atrial fibrillation with rate 117, PVC, non-specific IVCD, no comparison available. CXR with cardiomegaly without edema. Echo with EF 25-30%, mild LVH, severely dilated LV, normal RV size/function, normal LA size, mild MR/TR, and mildly elevated PA pressures. She was started on carvedilol for rate control/CHF and captopril per Cardiology fellow recommendations. Heparin gtt started for stroke ppx. She was diuresed with IV lasix 40mg  daily with UOP net -1.9L in the past 24 hours and -2.8L this admission. Cardiology asked to evaluate for new onset CHF and Afib.    Past Medical History:  Diagnosis Date  . Former tobacco use    Quit in 2018    History reviewed. No pertinent surgical history.   Home Medications:  Prior to Admission medications   Medication Sig Start Date End Date Taking? Authorizing Provider  ibuprofen (ADVIL) 200 MG tablet Take 200 mg by mouth daily as needed for moderate pain.   Yes [provider]    Inpatient Medications: Scheduled Meds: . captopril  6.25 mg Oral Q8H  . carvedilol  6.25 mg Oral BID WC  . furosemide  40 mg Intravenous Daily  . insulin aspart  0-9 Units Subcutaneous TID WC  . potassium chloride  40 mEq Oral BID   Continuous Infusions: . heparin 1,100 Units/hr (09/30/19 0555)   PRN Meds:   Allergies:  Allergies  Allergen Reactions  . Erythromycin Rash    Social History:   Social History   Socioeconomic History  . Marital status: Married    Spouse name: Not on file  . Number of children: Not on file  . Years of education: Not on file  . Highest education level: Not on file  Occupational History  . Not on file  Social Needs  . Financial resource strain:  Not on file  . Food insecurity    Worry: Not on file    Inability: Not on file  . Transportation needs    Medical: Not on file    Non-medical: Not on file  Tobacco Use  . Smoking status: Former Research scientist (life sciences)  . Smokeless tobacco: Never Used  Substance and Sexual Activity  . Alcohol use: Not Currently  . Drug use: Not on file  . Sexual activity: Not on file  Lifestyle  . Physical activity    Days per week: Not on file    Minutes per session: Not on file  . Stress: Not on file  Relationships  . Social Herbalist on phone: Not on file    Gets together: Not on file    Attends religious service: Not on file    Active member of club or organization: Not on file    Attends meetings of clubs or organizations: Not on file    Relationship status: Not on file  . Intimate partner violence    Fear of current or ex partner: Not on file    Emotionally abused: Not on file    Physically abused: Not on file    Forced sexual activity: Not on file  Other Topics Concern  . Not on file  Social History Narrative  . Not on file    Family History:    Family History  Problem Relation Age of Onset  . Thyroid disease Mother   . Dementia Father   . High Cholesterol Father      ROS:  Please see the history of present illness.   All other ROS reviewed and negative.     Physical Exam/Data:   Vitals:   09/29/19 2248 09/30/19 0500 09/30/19 0558 09/30/19 0851  BP: (!) 114/57  (!) 142/77 137/77  Pulse: 77  72 86  Resp: 20     Temp:   97.7 F (36.5 C)   TempSrc:   Oral   SpO2:   93%   Weight:  88.7 kg    Height:        Intake/Output Summary (Last 24 hours) at 09/30/2019 1506 Last data filed at 09/30/2019 0900 Gross per 24 hour  Intake 1204.75 ml  Output 250 ml  Net 954.75 ml   Filed Weights   09/28/19 1313 09/29/19 0655 09/30/19 0500  Weight: 94.6 kg 92.5 kg 88.7 kg   Body mass index is 31.57 kg/m.  General:  Well nourished, well developed, sitting upright in bed in no  acute distress HEENT: sclera anicteric  Neck: no JVD Vascular: No carotid bruits; distal pulses 2+ bilaterally Cardiac:  normal S1, S2; IRIR; no murmurs, rubs, or galloops Lungs:  clear to auscultation bilaterally, no wheezing, rhonchi or rales  Abd: NABS, soft, nontender, no hepatomegaly Ext: 1+ LE edema Musculoskeletal:  No deformities, BUE and BLE strength normal and equal Skin: warm and dry  Neuro:  CNs 2-12 intact, no focal abnormalities noted Psych:  Normal affect   EKG:  The EKG was personally reviewed and demonstrates:  Coarse atrial fibrillation with rate 117, PVC, non-specific IVCD, no comparison available. Telemetry:  Telemetry was personally reviewed and demonstrates:  Coarse atrial fibrillation vs atrial flutter with variable AV block with rate primarily in the 80s  Relevant CV Studies: Echocardiogram 09/29/2019: 1. Left ventricular ejection fraction, by visual estimation, is 25 to 30%. The left ventricle has severely decreased function. Severely increased left ventricular size. There is mildly increased left ventricular hypertrophy.  2. Left ventricular diastolic function could not be evaluated pattern of LV diastolic filling.  3. Global right ventricle has normal systolic function.The right ventricular size is normal.  4. Left atrial size was normal.  5. Right atrial size was normal.  6. Mild mitral annular calcification.  7. The mitral valve is abnormal. Mild mitral valve regurgitation. No evidence of mitral stenosis.  8. The tricuspid valve is normal in structure. Tricuspid valve regurgitation is mild.  9. The aortic valve is tricuspid Aortic valve regurgitation is trivial by color flow Doppler. Mild aortic valve sclerosis without stenosis. 10. The pulmonic valve was not well visualized. Pulmonic valve regurgitation is not visualized by color flow Doppler. 11. Mildly elevated pulmonary artery systolic pressure. 12. The inferior vena cava is dilated in size with <50%  respiratory variability, suggesting right atrial pressure of 15 mmHg. 13. Akinesis of the anteroseptal, apical and inferior walls; overall severely reduced LV systolic function; severe LVE; mild LVH; trace AI; mild MR and TR.  Laboratory Data:  Chemistry Recent Labs  Lab 09/28/19 2131 09/29/19 0650 09/30/19 0521  NA 141 141 142  K 3.6 3.4* 3.2*  CL 103 103 101  CO2 27 22 29   GLUCOSE 125* 122* 103*  BUN 14 13 21   CREATININE 0.87 0.88 0.86  CALCIUM 8.8* 8.6* 8.4*  GFRNONAA >60 >60 >60  GFRAA >60 >60 >60  ANIONGAP 11 16* 12    Recent Labs  Lab 09/28/19 1823 09/28/19 2131  PROT 7.2 5.9*  ALBUMIN 4.1 3.6  AST 33 33  ALT 40 38  ALKPHOS 77 68  BILITOT 1.4* 1.4*   Hematology Recent Labs  Lab 09/28/19 1823 09/29/19 0516 09/30/19 0521  WBC 6.9 6.3 5.7  RBC 4.70 3.94 4.24  HGB 14.8 12.6 13.6  HCT 46.5* 39.4 42.5  MCV 98.9 100.0 100.2*  MCH 31.5 32.0 32.1  MCHC 31.8 32.0 32.0  RDW 13.6 13.6 13.8  PLT 226 179 184   Cardiac EnzymesNo results for input(s): TROPONINI in the last 168 hours. No results for input(s): TROPIPOC in the last 168 hours.  BNP Recent Labs  Lab 09/28/19 1828  BNP 1,325.1*    DDimer No results for input(s): DDIMER in the last 168 hours.  Radiology/Studies:  Dg Chest 2 View  Result Date: 09/28/2019 CLINICAL DATA:  Increasing shortness of breath over the past 2 years, worse over the past week. EXAM: CHEST - 2 VIEW COMPARISON:  None. FINDINGS: There is cardiomegaly without edema. Very small bilateral pleural effusions and mild basilar atelectasis are seen. No pneumothorax. IMPRESSION: Cardiomegaly without edema. Very small bilateral pleural effusions and mild basilar atelectasis. Electronically Signed   By: Drusilla Kanner M.D.   On: 09/28/2019 13:59    Assessment and Plan:   1. Acute combined CHF: patient presented with progressive DOE. Occasional chest tightness. BNP 1325. EKG with atrial fibrillation with RVR with non-specific IVCD. CXR with  cardiomegaly but no edema. Echo with EF 25-30%. She was started on IV lasix 40mg  daily with UOP net -1.9L in the past 24 hours and -2.8L  this admission. Weight 208lbs>195lbs. Symptoms improved but still with mild LE edema and orthopnea. Possible this is tachycardia driven 2/2 atrial fibrillation with RVR on presentation but will likely need to rule out ischemic etiology. Risk factors for CAD include HTN, pre-DM, and former tobacco use.  - [facility]. The patient understands that risks included but are not limited to stroke (1 in 1000), death (1 in 1000), kidney failure [usually temporary] (1 in 500), bleeding (1 in 200), allergic reaction [possibly serious] (1 in 200).  The patient understands and agrees to proceed.  - Will check a lipid panel for risk stratification - Will increase carvedilol to 12.5mg  BID - Will stop captopril at this time with plans to start entresto following catheterization - Consider adding spironolactone if room in BP after addition of entresto.  - Continue IV lasix - anticipate transition to po in the next 24-48 hours. - Continue to monitor strict I&Os and daily weights - Anticipate repeat echocardiogram in 3 months to monitor for improvement in EF. If EF remains low, may need to refer to EP for ICD consideration  2. New onset atrial fibrillation with RVR: HR up to 110s on arrival. HR improved after starting carvedilol, though she still remains in atrial fibrillation. TSH wnl. She is unaware of her current atrial fibrillation.  - Continue carvedilol for rate control.  - This patients CHA2DS2-VASc Score and unadjusted Ischemic Stroke Rate (% per year) is equal to (at least) 4.8 % stroke rate/year from a score of 4 Above score calculated as 1 point each if present [CHF, HTN, DM, Vascular=MI/PAD/Aortic Plaque, Age if 65-74, or Female] Above score calculated as 2 points each if present [Age > 75, or Stroke/TIA/TE] - Continue heparin gtt for now  - plan to transition to eliquis once clear no further cardiac testing needed - Will consider TEE/DCCV after cardiac catheterization if she remains in atrial fibrillation   3. HTN: patient reports elevated blood pressures at dental appointments for several years. Not on any medications prior to this admission.  - Managed in the context of #1 and 2  4. Pre-DM: Hgb A1C 6.0 this admission. - Continue to encourage healthy dietary/lifestyle modifications to promote weight loss.   5. Mildly elevated troponin: trop trend 36>30. Not consistent with ACS. EKG with non-specific IVCD and afib with RVR with rate 117. No history of heart disease. No prior ischemic evaluation. Likely this is demand ischemia in the setting of #1 and 2.  - Ischemic evaluation per #1  For questions or updates, please contact [facility] Please consult [email or web address] for contact info under Cardiology/STEMI.   Signed, Beatriz StallionKrista M. Nahdia Doucet, PA-C  09/30/2019 3:06 PM [phone]

## 2019-10-01 ENCOUNTER — Encounter (HOSPITAL_COMMUNITY)
Admission: EM | Disposition: A | Discharge status: Home / Self Care | Payer: Self-pay | Source: Home / Self Care | Attending: Cardiology

## 2019-10-01 ENCOUNTER — Other Ambulatory Visit: Payer: Self-pay

## 2019-10-01 DIAGNOSIS — I5021 Acute systolic (congestive) heart failure: Secondary | ICD-10-CM

## 2019-10-01 DIAGNOSIS — I428 Other cardiomyopathies: Secondary | ICD-10-CM

## 2019-10-01 DIAGNOSIS — I5041 Acute combined systolic (congestive) and diastolic (congestive) heart failure: Secondary | ICD-10-CM | POA: Diagnosis present

## 2019-10-01 DIAGNOSIS — E876 Hypokalemia: Secondary | ICD-10-CM | POA: Diagnosis present

## 2019-10-01 DIAGNOSIS — R7303 Prediabetes: Secondary | ICD-10-CM

## 2019-10-01 DIAGNOSIS — I4891 Unspecified atrial fibrillation: Secondary | ICD-10-CM | POA: Diagnosis present

## 2019-10-01 HISTORY — PX: LEFT HEART CATH AND CORONARY ANGIOGRAPHY: CATH118249

## 2019-10-01 LAB — URINALYSIS, ROUTINE W REFLEX MICROSCOPIC
Bilirubin Urine: NEGATIVE
Glucose, UA: NEGATIVE mg/dL
Hgb urine dipstick: NEGATIVE
Ketones, ur: 5 mg/dL — AB
Leukocytes,Ua: NEGATIVE
Nitrite: NEGATIVE
Protein, ur: NEGATIVE mg/dL
Specific Gravity, Urine: 1.025 (ref 1.005–1.030)
pH: 6 (ref 5.0–8.0)

## 2019-10-01 LAB — BASIC METABOLIC PANEL
Anion gap: 11 (ref 5–15)
BUN: 23 mg/dL (ref 8–23)
CO2: 28 mmol/L (ref 22–32)
Calcium: 8.6 mg/dL — ABNORMAL LOW (ref 8.9–10.3)
Chloride: 102 mmol/L (ref 98–111)
Creatinine, Ser: 0.89 mg/dL (ref 0.44–1.00)
GFR calc Af Amer: >60 mL/min (ref >60)
GFR calc non Af Amer: >60 mL/min (ref >60)
Glucose, Bld: 104 mg/dL — ABNORMAL HIGH (ref 70–99)
Potassium: 3.5 mmol/L (ref 3.5–5.1)
Sodium: 141 mmol/L (ref 135–145)

## 2019-10-01 LAB — CBC
HCT: 43.7 % (ref 36.0–46.0)
Hemoglobin: 13.9 g/dL (ref 12.0–15.0)
MCH: 32.2 pg (ref 26.0–34.0)
MCHC: 31.8 g/dL (ref 30.0–36.0)
MCV: 101.2 fL — ABNORMAL HIGH (ref 80.0–100.0)
Platelets: 189 10*3/uL (ref 150–400)
RBC: 4.32 MIL/uL (ref 3.87–5.11)
RDW: 13.6 % (ref 11.5–15.5)
WBC: 5.6 10*3/uL (ref 4.0–10.5)
nRBC: 0 % (ref 0.0–0.2)

## 2019-10-01 LAB — GLUCOSE, CAPILLARY
Glucose-Capillary: 100 mg/dL — ABNORMAL HIGH (ref 70–99)
Glucose-Capillary: 84 mg/dL (ref 70–99)
Glucose-Capillary: 148 mg/dL — ABNORMAL HIGH (ref 70–99)

## 2019-10-01 LAB — HEPARIN LEVEL (UNFRACTIONATED): Heparin Unfractionated: 0.77 IU/mL — ABNORMAL HIGH (ref 0.30–0.70)

## 2019-10-01 SURGERY — LEFT HEART CATH AND CORONARY ANGIOGRAPHY
Anesthesia: LOCAL

## 2019-10-01 MED ORDER — HEPARIN SODIUM (PORCINE) 1000 UNIT/ML IJ SOLN
INTRAMUSCULAR | Status: DC | PRN
  Administered 2019-10-01: 4500 [IU] via INTRAVENOUS

## 2019-10-01 MED ORDER — VERAPAMIL HCL 2.5 MG/ML IV SOLN
INTRAVENOUS | Status: DC | PRN
  Administered 2019-10-01: 10 mL via INTRA_ARTERIAL

## 2019-10-01 MED ORDER — SODIUM CHLORIDE 0.9 % IV SOLN: 250.0000 mL | INTRAVENOUS | Status: DC | PRN

## 2019-10-01 MED ORDER — FENTANYL CITRATE (PF) 100 MCG/2ML IJ SOLN
INTRAMUSCULAR | Status: DC | PRN
  Administered 2019-10-01: 25 ug via INTRAVENOUS

## 2019-10-01 MED ORDER — SODIUM CHLORIDE 0.9 % IV SOLN: INTRAVENOUS | Status: DC

## 2019-10-01 MED ORDER — SODIUM CHLORIDE 0.9% FLUSH
3.0000 mL | Freq: Two times a day (BID) | INTRAVENOUS | Status: DC
  Administered 2019-10-02 – 2019-10-03 (×2): 3 mL via INTRAVENOUS

## 2019-10-01 MED ORDER — POTASSIUM CHLORIDE CRYS ER 20 MEQ PO TBCR
60.0000 meq | EXTENDED_RELEASE_TABLET | Freq: Once | ORAL | Status: AC
  Administered 2019-10-01: 60 meq via ORAL
  Filled 2019-10-01: qty 3

## 2019-10-01 MED ORDER — VERAPAMIL HCL 2.5 MG/ML IV SOLN
INTRAVENOUS | Status: AC
  Filled 2019-10-01: qty 2

## 2019-10-01 MED ORDER — HEPARIN (PORCINE) IN NACL 1000-0.9 UT/500ML-% IV SOLN
INTRAVENOUS | Status: DC | PRN
  Administered 2019-10-01 (×2): 500 mL

## 2019-10-01 MED ORDER — SODIUM CHLORIDE 0.9% FLUSH
3.0000 mL | INTRAVENOUS | Status: DC | PRN
  Administered 2019-10-01: 22:00:00 3 mL via INTRAVENOUS
  Filled 2019-10-01: qty 3

## 2019-10-01 MED ORDER — SODIUM CHLORIDE 0.9% FLUSH: 3.0000 mL | Freq: Two times a day (BID) | INTRAVENOUS | Status: DC

## 2019-10-01 MED ORDER — HEPARIN SODIUM (PORCINE) 1000 UNIT/ML IJ SOLN
INTRAMUSCULAR | Status: AC
  Filled 2019-10-01: qty 1

## 2019-10-01 MED ORDER — SODIUM CHLORIDE 0.9% FLUSH: 3.0000 mL | INTRAVENOUS | Status: DC | PRN

## 2019-10-01 MED ORDER — ACETAMINOPHEN 325 MG PO TABS: 650.0000 mg | ORAL_TABLET | ORAL | Status: DC | PRN

## 2019-10-01 MED ORDER — LIDOCAINE HCL (PF) 1 % IJ SOLN
INTRAMUSCULAR | Status: AC
  Filled 2019-10-01: qty 30

## 2019-10-01 MED ORDER — MIDAZOLAM HCL 2 MG/2ML IJ SOLN
INTRAMUSCULAR | Status: DC | PRN
  Administered 2019-10-01: 1 mg via INTRAVENOUS

## 2019-10-01 MED ORDER — ASPIRIN 81 MG PO CHEW
81.0000 mg | CHEWABLE_TABLET | ORAL | Status: AC
  Administered 2019-10-01: 81 mg via ORAL
  Filled 2019-10-01: qty 1

## 2019-10-01 MED ORDER — ONDANSETRON HCL 4 MG/2ML IJ SOLN: 4.0000 mg | Freq: Four times a day (QID) | INTRAMUSCULAR | Status: DC | PRN

## 2019-10-01 MED ORDER — IOHEXOL 350 MG/ML SOLN
INTRAVENOUS | Status: DC | PRN
  Administered 2019-10-01: 50 mL

## 2019-10-01 MED ORDER — HEPARIN (PORCINE) IN NACL 1000-0.9 UT/500ML-% IV SOLN
INTRAVENOUS | Status: AC
  Filled 2019-10-01: qty 1000

## 2019-10-01 MED ORDER — APIXABAN 5 MG PO TABS
5.0000 mg | ORAL_TABLET | Freq: Two times a day (BID) | ORAL | Status: DC
  Administered 2019-10-01 – 2019-10-03 (×4): 5 mg via ORAL
  Filled 2019-10-01 (×4): qty 1

## 2019-10-01 MED ORDER — HEPARIN (PORCINE) 25000 UT/250ML-% IV SOLN: 1000.0000 [IU]/h | INTRAVENOUS | Status: DC

## 2019-10-01 MED ORDER — MIDAZOLAM HCL 2 MG/2ML IJ SOLN
INTRAMUSCULAR | Status: AC
  Filled 2019-10-01: qty 2

## 2019-10-01 MED ORDER — FENTANYL CITRATE (PF) 100 MCG/2ML IJ SOLN
INTRAMUSCULAR | Status: AC
  Filled 2019-10-01: qty 2

## 2019-10-01 MED ORDER — LIDOCAINE HCL (PF) 1 % IJ SOLN
INTRAMUSCULAR | Status: DC | PRN
  Administered 2019-10-01: 2 mL

## 2019-10-01 SURGICAL SUPPLY — 9 items
CATH INFINITI 5FR JK (CATHETERS) ×1 IMPLANT
DEVICE RAD COMP TR BAND LRG (VASCULAR PRODUCTS) ×1 IMPLANT
GLIDESHEATH SLEND SS 6F .021 (SHEATH) ×1 IMPLANT
GUIDEWIRE INQWIRE 1.5J.035X260 (WIRE) IMPLANT
INQWIRE 1.5J .035X260CM (WIRE) ×2
KIT HEART LEFT (KITS) ×2 IMPLANT
PACK CARDIAC CATHETERIZATION (CUSTOM PROCEDURE TRAY) ×2 IMPLANT
TRANSDUCER W/STOPCOCK (MISCELLANEOUS) ×2 IMPLANT
TUBING CIL FLEX 10 FLL-RA (TUBING) ×2 IMPLANT

## 2019-10-01 NOTE — Progress Notes (Signed)
Spoke with Dr Buford Dresser , cardiology. Cardiac cath with nonischemic finding and cardiomyopathy likely secondary to prolonged A. fib. Recommends to keep the patient under cardiology service at [facility].

## 2019-10-01 NOTE — Progress Notes (Signed)
Assumed care of pt at 2330. Pt resting comfortably. Agree with previous RN assessment. Will continue to monitor the pt.

## 2019-10-01 NOTE — Progress Notes (Signed)
[place] for heparin Indication: atrial fibrillation  Allergies  Allergen Reactions  . Erythromycin Rash    Patient Measurements: Height: 5\' 6"  (167.6 cm) Weight: 195 lb 9.6 oz (88.7 kg) IBW/kg (Calculated) : 59.3 Heparin Dosing Weight: 80 kg  Vital Signs: Temp: 98.7 F (37.1 C) (10/14 0628) Temp Source: Oral (10/14 0628) BP: 127/78 (10/14 0628) Pulse Rate: 76 (10/14 0628)  Labs: Recent Labs    09/28/19 1823 09/28/19 1950 09/28/19 2106  09/29/19 0209  09/29/19 0516 09/29/19 0650 09/29/19 1416 09/30/19 0521 10/01/19 0519  HGB 14.8  --   --   --   --   --  12.6  --   --  13.6 13.9  HCT 46.5*  --   --   --   --   --  39.4  --   --  42.5 43.7  PLT 226  --   --   --   --   --  179  --   --  184 189  APTT  --   --  24  --   --   --   --   --   --   --   --   LABPROT  --   --  13.2  --   --   --   --   --   --   --   --   INR  --   --  1.0  --   --   --   --   --   --   --   --   HEPARINUNFRC  --   --   --   --   --    < > 0.55  --  0.61 0.45 0.77*  CREATININE 0.92  --   --    < >  --   --   --  0.88  --  0.86 0.89  TROPONINIHS 36* 36*  --   --  30*  --   --   --   --   --   --    < > = values in this interval not displayed.    Estimated Creatinine Clearance: 66 mL/min (by C-G formula based on SCr of 0.89 mg/dL).   Medical History: Past Medical History:  Diagnosis Date  . Former tobacco use    Quit in 2018    Assessment: Pharmacy consulted to dose/monitor heparin drip for this 68 year old female for atrial fibrillation. Pt not on anticoagulants PTA, does not follow with any PCP.   Today, 10/01/2019  Heparin level 0.77 - supratherapuetic; confirmed with nurse that level was not drawn near heparin infusion site.  CBC: Hgb is holding steady at 13.9. Initially saw a downward trend in platelets, likely a transient effect of starting heparin, but they seem to be trending back up, Plts 189 today.  No bleeding or infusion  related issues reported by nurse. Pt does have bruising around injection/IV sites.  Goal of Therapy:  Heparin level 0.3-0.7 units/ml Monitor platelets by anticoagulation protocol: Yes   Plan:  Decrease heparin infusion to 1000 units/hr Check 6 hour heparin level Monitor daily CBC and heparin level Monitor for s/sx of bleeding F/u transition to [place], PharmD Candidate 10/01/2019,8:18 AM

## 2019-10-01 NOTE — Interval H&P Note (Signed)
Cath Lab Visit (complete for each Cath Lab visit)  Clinical Evaluation Leading to the Procedure:   ACS: Yes.  unstable angina with acute systolic heart failure ([place] heart association class IV)  Non-ACS:  N/a   History and Physical Interval Note:  10/01/2019 12:37 PM  Evelyn Phelps  has presented today for surgery, with the diagnosis of heart failure.  The various methods of treatment have been discussed with the patient and family. After consideration of risks, benefits and other options for treatment, the patient has consented to  Procedure(s): LEFT HEART CATH AND CORONARY ANGIOGRAPHY (N/A) as a surgical intervention.  The patient's history has been reviewed, patient examined, no change in status, stable for surgery.  I have reviewed the patient's chart and labs.  Questions were answered to the patient's satisfaction.     Kathlyn [place]

## 2019-10-01 NOTE — Progress Notes (Addendum)
Advised by cath lab at [facility] to stop Heparin upon transferring for procedure. EKG, consent and second IV site to be obtained before transfer. Awaiting [facility]. Eulas Post, RN

## 2019-10-01 NOTE — Progress Notes (Addendum)
PROGRESS NOTE                                                                                                                                                                                                             Patient Demographics:    Evelyn Phelps, is a 68 y.o. female, DOB - [birth year], SRP:[id number]  Admit date - 09/28/2019   Admitting Physician Anselm Jungling, DO  Outpatient Primary MD for the patient is Patient, No Pcp Per  LOS - 3  Outpatient Specialists: None  Chief Complaint  Patient presents with  . Shortness of Breath  . leg swelling       Brief Narrative 68 year old female who has not seen a PCP for several years presented with progressive shortness of breath and increasing bilateral leg edema for past 2 weeks.  Reported some chest tightness but no orthopnea or PND. In the ED she had low-grade fever, hypertensive with systolic blood pressure of 180s, K of 3 and glucose of 148.  BNP was 1325.  High sensitive troponin of 36.  Chest x-ray showed cardiomegaly with bilateral pleural effusion.  EKG showed atrial fibrillation with RVR. Placed on IV Lasix, IV heparin and cardiology consulted. 2D echo done with findings of new onset CHF with severely reduced EF of 25-30%.   Subjective:    patient frustrated that she didn't get help to brush and wash her face on time. Also worried about delay in her cardiac cath. 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  Continue Coreg 12.5 mg twice daily added ACE inhibitor.  Patient sent to [facility] for cardiac cath today.  Plan on starting Entresto following cardiac cath and stable renal function. LDL of 113.  She will need statin as well. Strict I/O and daily weight.  Cardiology consult  appreciated.  New onset A. fib with RVR Rate controlled.  Continue Coreg.  Her chads 2 Vasc score is 4.  Needs anticoagulation.  Currently on IV heparin and plan to transition to oral Eliquis.  May need TEE with cardioversion if she remains in A. fib with RVR.  Active symptoms Essential hypertension Elevated blood pressure on  presentation.  Now better controlled.  Prediabetes A1c of 6.  Being counseled on dietary and lifestyle modifications.  Hypokalemia Replenished  Code Status : Full code  Family Communication  : None (lives with husband)  Disposition Plan  : Home possibly in the next 48 hours if continues to improve  Barriers For Discharge : Active symptoms  Consults  : Cardiology  Procedures  : 2D echo, cardiac cath  DVT Prophylaxis  : IV heparin   Patient will need to be established with outpatient PCP and cardiology upon discharge  Lab Results  Component Value Date   PLT 189 10/01/2019    Antibiotics  :    Anti-infectives (From admission, onward)   None        Objective:   Vitals:   09/30/19 0851 09/30/19 1509 09/30/19 2223 10/01/19 0628  BP: 137/77 131/78 (!) 136/58 127/78  Pulse: 86 76 74 76  Resp:  18 16 15   Temp:  98.5 F (36.9 C) 98 F (36.7 C) 98.7 F (37.1 C)  TempSrc:  Oral  Oral  SpO2:  93% 95%   Weight:      Height:        Wt Readings from Last 3 Encounters:  09/30/19 88.7 kg     Intake/Output Summary (Last 24 hours) at 10/01/2019 0943 Last data filed at 10/01/2019 0630 Gross per 24 hour  Intake 1343.01 ml  Output 800 ml  Net 543.01 ml     Physical Exam  Gen: not in distress HEENT: moist mucosa, supple neck Chest: clear b/l, no added sounds CVS: S1-S2 irregularly regular, no murmurs GI: soft, NT, ND, BS+ Musculoskeletal: warm, trace edema,     Data Review:    CBC Recent Labs  Lab 09/28/19 1823 09/29/19 0516 09/30/19 0521 10/01/19 0519  WBC 6.9 6.3 5.7 5.6  HGB 14.8 12.6 13.6 13.9  HCT 46.5* 39.4 42.5  43.7  PLT 226 179 184 189  MCV 98.9 100.0 100.2* 101.2*  MCH 31.5 32.0 32.1 32.2  MCHC 31.8 32.0 32.0 31.8  RDW 13.6 13.6 13.8 13.6  LYMPHSABS 1.8  --   --   --   MONOABS 0.8  --   --   --   EOSABS 0.0  --   --   --   BASOSABS 0.0  --   --   --     Chemistries  Recent Labs  Lab 09/28/19 1823 09/28/19 2131 09/29/19 0650 09/30/19 0521 09/30/19 1842 10/01/19 0519  NA 141 141 141 142  --  141  K 3.0* 3.6 3.4* 3.2* 3.9 3.5  CL 102 103 103 101  --  102  CO2 24 27 22 29   --  28  GLUCOSE 148* 125* 122* 103*  --  104*  BUN 14 14 13 21   --  23  CREATININE 0.92 0.87 0.88 0.86  --  0.89  CALCIUM 9.1 8.8* 8.6* 8.4*  --  8.6*  MG  --   --   --  1.8  --   --   AST 33 33  --   --   --   --   ALT 40 38  --   --   --   --   ALKPHOS 77 68  --   --   --   --   BILITOT 1.4* 1.4*  --   --   --   --    ------------------------------------------------------------------------------------------------------------------ Recent Labs    09/30/19 0527  CHOL 172  HDL  42  LDLCALC 113*  TRIG 83  CHOLHDL 4.1    Lab Results  Component Value Date   HGBA1C 6.0 (H) 09/29/2019   ------------------------------------------------------------------------------------------------------------------ Recent Labs    09/28/19 2132  TSH 1.459   ------------------------------------------------------------------------------------------------------------------ No results for input(s): VITAMINB12, FOLATE, FERRITIN, TIBC, IRON, RETICCTPCT in the last 72 hours.  Coagulation profile Recent Labs  Lab 09/28/19 2106  INR 1.0    No results for input(s): DDIMER in the last 72 hours.  Cardiac Enzymes No results for input(s): CKMB, TROPONINI, MYOGLOBIN in the last 168 hours.  Invalid input(s): CK ------------------------------------------------------------------------------------------------------------------    Component Value Date/Time   BNP 1,325.1 (H) 09/28/2019 1828    Inpatient Medications   Scheduled Meds: . carvedilol  12.5 mg Oral BID WC  . furosemide  40 mg Oral Daily  . insulin aspart  0-9 Units Subcutaneous TID WC  . sodium chloride flush  3 mL Intravenous Q12H   Continuous Infusions: . sodium chloride    . sodium chloride    . heparin 1,000 Units/hr (10/01/19 0918)   PRN Meds:.sodium chloride, sodium chloride flush  Micro Results Recent Results (from the past 240 hour(s))  SARS Coronavirus 2 by RT PCR (hospital order, performed in [facility] hospital lab) Nasopharyngeal Nasopharyngeal Swab     Status: None   Collection Time: 09/28/19  7:40 PM   Specimen: Nasopharyngeal Swab  Result Value Ref Range Status   SARS Coronavirus 2 NEGATIVE NEGATIVE Final    Comment: (NOTE) If result is NEGATIVE SARS-CoV-2 target nucleic acids are NOT DETECTED. The SARS-CoV-2 RNA is generally detectable in upper and lower  respiratory specimens during the acute phase of infection. The lowest  concentration of SARS-CoV-2 viral copies this assay can detect is 250  copies / mL. A negative result does not preclude SARS-CoV-2 infection  and should not be used as the sole basis for treatment or other  patient management decisions.  A negative result may occur with  improper specimen collection / handling, submission of specimen other  than nasopharyngeal swab, presence of viral mutation(s) within the  areas targeted by this assay, and inadequate number of viral copies  (<250 copies / mL). A negative result must be combined with clinical  observations, patient history, and epidemiological information. If result is POSITIVE SARS-CoV-2 target nucleic acids are DETECTED. The SARS-CoV-2 RNA is generally detectable in upper and lower  respiratory specimens dur ing the acute phase of infection.  Positive  results are indicative of active infection with SARS-CoV-2.  Clinical  correlation with patient history and other diagnostic information is  necessary to determine patient infection  status.  Positive results do  not rule out bacterial infection or co-infection with other viruses. If result is PRESUMPTIVE POSTIVE SARS-CoV-2 nucleic acids MAY BE PRESENT.   A presumptive positive result was obtained on the submitted specimen  and confirmed on repeat testing.  While 2019 novel coronavirus  (SARS-CoV-2) nucleic acids may be present in the submitted sample  additional confirmatory testing may be necessary for epidemiological  and / or clinical management purposes  to differentiate between  SARS-CoV-2 and other Sarbecovirus currently known to infect humans.  If clinically indicated additional testing with an alternate test  methodology [phone]) is advised. The SARS-CoV-2 RNA is generally  detectable in upper and lower respiratory sp ecimens during the acute  phase of infection. The expected result is Negative. Fact Sheet for Patients:  BoilerBrush.com.cy Fact Sheet for Healthcare Providers: https://pope.com/ This test is not yet approved or cleared  by the [place] and has been authorized for detection and/or diagnosis of SARS-CoV-2 by FDA under an Emergency Use Authorization (EUA).  This EUA will remain in effect (meaning this test can be used) for the duration of the COVID-19 declaration under Section 564(b)(1) of the Act, 21 U.S.C. section 360bbb-3(b)(1), unless the authorization is terminated or revoked sooner. Performed at [facility], [place] [address] [place], [place] [address]     Radiology Reports Dg Chest 2 View  Result Date: 09/28/2019 CLINICAL DATA:  Increasing shortness of breath over the past 2 years, worse over the past week. EXAM: CHEST - 2 VIEW COMPARISON:  None. FINDINGS: There is cardiomegaly without edema. Very small bilateral pleural effusions and mild basilar atelectasis are seen. No pneumothorax. IMPRESSION: Cardiomegaly without edema. Very small bilateral pleural  effusions and mild basilar atelectasis. Electronically Signed   By: Inge Rise M.D.   On: 09/28/2019 13:59    Time Spent in minutes  35   Jamael Hoffmann M.D on 10/01/2019 at 9:43 AM  Between 7am to 7pm - Pager - [phone]  After 7pm go to [email or web address] - password [facility]  [facility] -  Office  [phone]

## 2019-10-01 NOTE — Progress Notes (Signed)
TR BAND REMOVAL  LOCATION:    Radial rt radial  DEFLATED PER PROTOCOL:   yes  TIME BAND OFF / DRESSING APPLIED:    1545/gauze and tegaderm  SITE UPON ARRIVAL:    Level 0  SITE AFTER BAND REMOVAL:    Level 0  CIRCULATION SENSATION AND MOVEMENT:    Within Normal Limits : yes, rt hand and fingers warm and pink, palpable radial pulse, rt arm resting on pillow elevated  COMMENTS:

## 2019-10-01 NOTE — Care Management Important Message (Signed)
Important Message  Patient Details  Name: EMMAGRACE RUNKEL MRN: [id number] Date of Birth: [birth year]   Medicare Important Message Given:  Yes. CMA printed out IM for the Case Management Nurse or CSW to give to the patient.      Harce Volden 10/01/2019, 8:20 AM

## 2019-10-01 NOTE — Progress Notes (Signed)
Progress Note  Patient Name: Evelyn Phelps Date of Encounter: 10/01/2019  Primary Cardiologist: No primary care provider on file.   Subjective   Patient reports breathing is better. She went for a walk in the hallway yesterday and did not have to take a break. No complaints of chest pain or palpitations. We discussed the plan at length today.   Inpatient Medications    Scheduled Meds: . carvedilol  12.5 mg Oral BID WC  . furosemide  40 mg Oral Daily  . insulin aspart  0-9 Units Subcutaneous TID WC  . potassium chloride  60 mEq Oral Once  . sodium chloride flush  3 mL Intravenous Q12H   Continuous Infusions: . sodium chloride    . sodium chloride    . heparin 1,000 Units/hr (10/01/19 0918)   PRN Meds: sodium chloride, sodium chloride flush   Vital Signs    Vitals:   09/30/19 1509 09/30/19 2223 10/01/19 0628 10/01/19 1000  BP: 131/78 (!) 136/58 127/78   Pulse: 76 74 76   Resp: 18 16 15    Temp: 98.5 F (36.9 C) 98 F (36.7 C) 98.7 F (37.1 C)   TempSrc: Oral  Oral   SpO2: 93% 95%    Weight:    87.2 kg  Height:        Intake/Output Summary (Last 24 hours) at 10/01/2019 1143 Last data filed at 10/01/2019 0630 Gross per 24 hour  Intake 1298.99 ml  Output 800 ml  Net 498.99 ml   Filed Weights   09/29/19 0655 09/30/19 0500 10/01/19 1000  Weight: 92.5 kg 88.7 kg 87.2 kg    Telemetry    Atrial fibrillation with CVR - Personally Reviewed  ECG    No new tracings - Personally Reviewed  Physical Exam   GEN:  Sitting upright in bed in no acute distress.   Neck: No JVD, no carotid bruits Cardiac: IRIR, no murmurs, rubs, or gallops.  Respiratory: Clear to auscultation bilaterally, no wheezes/ rales/ rhonchi GI: NABS, Soft, nontender, non-distended  MS: No edema; No deformity. Neuro:  Nonfocal, moving all extremities spontaneously Psych: Normal affect   Labs    Chemistry Recent Labs  Lab 09/28/19 1823 09/28/19 2131 09/29/19 0650 09/30/19 0521  09/30/19 1842 10/01/19 0519  NA 141 141 141 142  --  141  K 3.0* 3.6 3.4* 3.2* 3.9 3.5  CL 102 103 103 101  --  102  CO2 24 27 22 29   --  28  GLUCOSE 148* 125* 122* 103*  --  104*  BUN 14 14 13 21   --  23  CREATININE 0.92 0.87 0.88 0.86  --  0.89  CALCIUM 9.1 8.8* 8.6* 8.4*  --  8.6*  PROT 7.2 5.9*  --   --   --   --   ALBUMIN 4.1 3.6  --   --   --   --   AST 33 33  --   --   --   --   ALT 40 38  --   --   --   --   ALKPHOS 77 68  --   --   --   --   BILITOT 1.4* 1.4*  --   --   --   --   GFRNONAA >60 >60 >60 >60  --  >60  GFRAA >60 >60 >60 >60  --  >60  ANIONGAP 15 11 16* 12  --  11     Hematology Recent Labs  Lab 09/29/19 0516 09/30/19 0521 10/01/19 0519  WBC 6.3 5.7 5.6  RBC 3.94 4.24 4.32  HGB 12.6 13.6 13.9  HCT 39.4 42.5 43.7  MCV 100.0 100.2* 101.2*  MCH 32.0 32.1 32.2  MCHC 32.0 32.0 31.8  RDW 13.6 13.8 13.6  PLT 179 184 189    Cardiac EnzymesNo results for input(s): TROPONINI in the last 168 hours. No results for input(s): TROPIPOC in the last 168 hours.   BNP Recent Labs  Lab 09/28/19 1828  BNP 1,325.1*     DDimer No results for input(s): DDIMER in the last 168 hours.   Radiology    No results found.  Cardiac Studies   Echocardiogram 09/29/2019: 1. Left ventricular ejection fraction, by visual estimation, is 25 to 30%. The left ventricle has severely decreased function. Severely increased left ventricular size. There is mildly increased left ventricular hypertrophy. 2. Left ventricular diastolic function could not be evaluated pattern of LV diastolic filling. 3. Global right ventricle has normal systolic function.The right ventricular size is normal. 4. Left atrial size was normal. 5. Right atrial size was normal. 6. Mild mitral annular calcification. 7. The mitral valve is abnormal. Mild mitral valve regurgitation. No evidence of mitral stenosis. 8. The tricuspid valve is normal in structure. Tricuspid valve regurgitation is mild. 9.  The aortic valve is tricuspid Aortic valve regurgitation is trivial by color flow Doppler. Mild aortic valve sclerosis without stenosis. 10. The pulmonic valve was not well visualized. Pulmonic valve regurgitation is not visualized by color flow Doppler. 11. Mildly elevated pulmonary artery systolic pressure. 12. The inferior vena cava is dilated in size with <50% respiratory variability, suggesting right atrial pressure of 15 mmHg. 13. Akinesis of the anteroseptal, apical and inferior walls; overall severely reduced LV systolic function; severe LVE; mild LVH; trace AI; mild MR and TR.  Patient Profile     68 y.o. female who has not been seen by a medical provider in quite some time, former tobacco abuse (quit in 2018), who is being followed by cardiology for the evaluation of acute combined CHF and atrial fibrillation  Assessment & Plan    1. Acute combined CHF: patient presented with progressive DOE. Occasional chest tightness. BNP 1325. EKG with atrial fibrillation with RVR with non-specific IVCD. CXR with cardiomegaly but no edema. Echo with EF 25-30%. She was started on IV lasix 40mg  daily with UOP net +671mL in the past 24 hours and -2.2L this admission. Weight 208lbs>192lbs. Symptoms improved. Possible this is tachycardia driven 2/2 atrial fibrillation with RVR on presentation but will likely need to rule out ischemic etiology. Risk factors for CAD include HTN, pre-DM, and former tobacco use.  - Will plan for LHC today to evaluate for CAD. If CAD noted, will likely need to start statin as LDL 113 this admission. - Continue carvedilol to 12.5mg  BID - Plans to start entresto following catheterization if Cr stable - Consider adding spironolactone if room in BP after addition of entresto.  - Continue IV lasix - anticipate transition to po in the next 24-48 hours. - Continue to monitor strict I&Os and daily weights - Anticipate repeat echocardiogram in 3 months to monitor for improvement in EF.  If EF remains low, may need to refer to EP for ICD consideration  2. New onset atrial fibrillation with RVR: HR up to 110s on arrival. HR improved after starting carvedilol, though she still remains in atrial fibrillation. TSH wnl. She is unaware of her current atrial fibrillation.  - Continue carvedilol  for rate control.  - This patients CHA2DS2-VASc Score and unadjusted Ischemic Stroke Rate (% per year) is equal to (at least) 4.8 % stroke rate/year from a score of 4 Above score calculated as 1 point each if present [CHF, HTN, DM, Vascular=MI/PAD/Aortic Plaque, Age if 65-74, or Female] Above score calculated as 2 points each if present [Age > 75, or Stroke/TIA/TE] - Continue heparin gtt for now - plan to transition to eliquis once clear no further cardiac testing needed - Will consider TEE/DCCV after cardiac catheterization if she remains in atrial fibrillation   3. HTN: BP stable this admission. Patient reports elevated blood pressures at dental appointments for several years. Not on any medications prior to this admission.  - Managed in the context of #1 and 2  4. Pre-DM: Hgb A1C 6.0 this admission. - Continue to encourage healthy dietary/lifestyle modifications to promote weight loss.   5. Mildly elevated troponin: trop trend 36>30. Not consistent with ACS. EKG with non-specific IVCD and afib with RVR with rate 117. No history of heart disease. No prior ischemic evaluation. Likely this is demand ischemia in the setting of #1 and 2.  - Ischemic evaluation per #1   For questions or updates, please contact [place] Please consult [email or web address] for contact info under Cardiology/STEMI.      Signed, Abigail Butts, PA-C  10/01/2019, 11:43 AM   [phone]

## 2019-10-02 ENCOUNTER — Encounter (HOSPITAL_COMMUNITY): Payer: Self-pay | Admitting: Cardiovascular Disease

## 2019-10-02 DIAGNOSIS — I509 Heart failure, unspecified: Secondary | ICD-10-CM

## 2019-10-02 LAB — BASIC METABOLIC PANEL
Anion gap: 10 (ref 5–15)
BUN: 22 mg/dL (ref 8–23)
CO2: 26 mmol/L (ref 22–32)
Calcium: 8.8 mg/dL — ABNORMAL LOW (ref 8.9–10.3)
Chloride: 106 mmol/L (ref 98–111)
Creatinine, Ser: 0.94 mg/dL (ref 0.44–1.00)
GFR calc Af Amer: >60 mL/min (ref >60)
GFR calc non Af Amer: >60 mL/min (ref >60)
Glucose, Bld: 110 mg/dL — ABNORMAL HIGH (ref 70–99)
Potassium: 4.4 mmol/L (ref 3.5–5.1)
Sodium: 142 mmol/L (ref 135–145)

## 2019-10-02 LAB — GLUCOSE, CAPILLARY
Glucose-Capillary: 120 mg/dL — ABNORMAL HIGH (ref 70–99)
Glucose-Capillary: 135 mg/dL — ABNORMAL HIGH (ref 70–99)
Glucose-Capillary: 102 mg/dL — ABNORMAL HIGH (ref 70–99)
Glucose-Capillary: 153 mg/dL — ABNORMAL HIGH (ref 70–99)

## 2019-10-02 MED ORDER — SACUBITRIL-VALSARTAN 24-26 MG PO TABS
1.0000 | ORAL_TABLET | Freq: Two times a day (BID) | ORAL | Status: DC
  Administered 2019-10-02 – 2019-10-03 (×3): 1 via ORAL
  Filled 2019-10-02 (×3): qty 1

## 2019-10-02 MED ORDER — SODIUM CHLORIDE 0.9% FLUSH: 3.0000 mL | INTRAVENOUS | Status: DC | PRN

## 2019-10-02 MED ORDER — SODIUM CHLORIDE 0.9% FLUSH
3.0000 mL | Freq: Two times a day (BID) | INTRAVENOUS | Status: DC
  Administered 2019-10-03: 3 mL via INTRAVENOUS

## 2019-10-02 MED ORDER — SODIUM CHLORIDE 0.9 % IV SOLN
INTRAVENOUS | Status: DC
  Administered 2019-10-03: 10:00:00 via INTRAVENOUS

## 2019-10-02 MED ORDER — SODIUM CHLORIDE 0.9 % IV SOLN: 250.0000 mL | INTRAVENOUS | Status: DC

## 2019-10-02 NOTE — Progress Notes (Signed)
Progress Note  Patient Name: Evelyn Phelps Date of Encounter: 10/02/2019  Primary Cardiologist: Buford Dresser, MD   Subjective   Did not sleep well overnight, couldn't get comfortable. No chest pain. We spent significant time today reviewing medications, how they work, how they help the heart. Also discussed cath results again as well as TEE-CV planned for tomorrow. She is anxious but all questions answered.  Inpatient Medications    Scheduled Meds: . apixaban  5 mg Oral BID  . carvedilol  12.5 mg Oral BID WC  . insulin aspart  0-9 Units Subcutaneous TID WC  . sacubitril-valsartan  1 tablet Oral BID  . sodium chloride flush  3 mL Intravenous Q12H  . sodium chloride flush  3 mL Intravenous Q12H   Continuous Infusions: . sodium chloride    . sodium chloride Stopped (10/02/19 0940)   PRN Meds: sodium chloride, acetaminophen, ondansetron (ZOFRAN) IV, sodium chloride flush, sodium chloride flush   Vital Signs    Vitals:   10/02/19 0412 10/02/19 0632 10/02/19 0816 10/02/19 0837  BP: (!) 142/82  129/77   Pulse: 70  66   Resp: 20  (!) 7 18  Temp: 98 F (36.7 C)  97.9 F (36.6 C)   TempSrc: Oral  Oral   SpO2: 95%  94%   Weight:  87.1 kg    Height:        Intake/Output Summary (Last 24 hours) at 10/02/2019 1014 Last data filed at 10/02/2019 0837 Gross per 24 hour  Intake 240 ml  Output 950 ml  Net -710 ml   Filed Weights   10/01/19 1000 10/01/19 1749 10/02/19 9147  Weight: 87.2 kg 87.1 kg 87.1 kg    Telemetry    Atrial fibrillation - Personally Reviewed  Physical Exam   GEN: No acute distress.   Neck: JVD just at clavicle at 60 degrees Cardiac: irregularly irregular S1 and S2, no murmurs, rubs, or gallops.  Respiratory: Clear to auscultation bilaterally GI: NABS, Soft, nontender, non-distended  MS: No edema; No deformity. Neuro:  Nonfocal, moving all extremities spontaneously Psych: Normal affect   Labs    Chemistry Recent Labs  Lab 09/28/19  1823 09/28/19 2131  09/30/19 0521 09/30/19 1842 10/01/19 0519 10/02/19 0446  NA 141 141   < > 142  --  141 142  K 3.0* 3.6   < > 3.2* 3.9 3.5 4.4  CL 102 103   < > 101  --  102 106  CO2 24 27   < > 29  --  28 26  GLUCOSE 148* 125*   < > 103*  --  104* 110*  BUN 14 14   < > 21  --  23 22  CREATININE 0.92 0.87   < > 0.86  --  0.89 0.94  CALCIUM 9.1 8.8*   < > 8.4*  --  8.6* 8.8*  PROT 7.2 5.9*  --   --   --   --   --   ALBUMIN 4.1 3.6  --   --   --   --   --   AST 33 33  --   --   --   --   --   ALT 40 38  --   --   --   --   --   ALKPHOS 77 68  --   --   --   --   --   BILITOT 1.4* 1.4*  --   --   --   --   --  GFRNONAA >60 >60   < > >60  --  >60 >60  GFRAA >60 >60   < > >60  --  >60 >60  ANIONGAP 15 11   < > 12  --  11 10   < > = values in this interval not displayed.     Hematology Recent Labs  Lab 09/29/19 0516 09/30/19 0521 10/01/19 0519  WBC 6.3 5.7 5.6  RBC 3.94 4.24 4.32  HGB 12.6 13.6 13.9  HCT 39.4 42.5 43.7  MCV 100.0 100.2* 101.2*  MCH 32.0 32.1 32.2  MCHC 32.0 32.0 31.8  RDW 13.6 13.8 13.6  PLT 179 184 189    Cardiac EnzymesNo results for input(s): TROPONINI in the last 168 hours. No results for input(s): TROPIPOC in the last 168 hours.   BNP Recent Labs  Lab 09/28/19 1828  BNP 1,325.1*     DDimer No results for input(s): DDIMER in the last 168 hours.   Radiology    No results found.  Cardiac Studies   Echocardiogram 09/29/2019: 1. Left ventricular ejection fraction, by visual estimation, is 25 to 30%. The left ventricle has severely decreased function. Severely increased left ventricular size. There is mildly increased left ventricular hypertrophy. 2. Left ventricular diastolic function could not be evaluated pattern of LV diastolic filling. 3. Global right ventricle has normal systolic function.The right ventricular size is normal. 4. Left atrial size was normal. 5. Right atrial size was normal. 6. Mild mitral annular  calcification. 7. The mitral valve is abnormal. Mild mitral valve regurgitation. No evidence of mitral stenosis. 8. The tricuspid valve is normal in structure. Tricuspid valve regurgitation is mild. 9. The aortic valve is tricuspid Aortic valve regurgitation is trivial by color flow Doppler. Mild aortic valve sclerosis without stenosis. 10. The pulmonic valve was not well visualized. Pulmonic valve regurgitation is not visualized by color flow Doppler. 11. Mildly elevated pulmonary artery systolic pressure. 12. The inferior vena cava is dilated in size with <50% respiratory variability, suggesting right atrial pressure of 15 mmHg. 13. Akinesis of the anteroseptal, apical and inferior walls; overall severely reduced LV systolic function; severe LVE; mild LVH; trace AI; mild MR and TR.  Left heart catheterization 10/01/2019:  Prox RCA lesion is 20% stenosed.  There is severe left ventricular systolic dysfunction.  LV end diastolic pressure is normal.  The left ventricular ejection fraction is 25-35% by visual estimate.   1.  Mild stenosis in a small nondominant right coronary artery.  Otherwise normal coronary arteries. 2.  Severely reduced LV systolic function. 3.  Normal left ventricular end-diastolic pressure.  Recommendations: The patient has nonischemic cardiomyopathy likely tachycardia induced. Recommend medical therapy for cardiomyopathy. Consider TEE guided cardioversion tomorrow.  I am going to start the patient on Eliquis 5 mg twice daily to be started this evening.  Patient Profile     68 y.o.femalewho has not been seen by a medical provider in quite some time, former tobacco abuse (quit in 2018),who is being followed by cardiology for the evaluation ofacute combined CHF and atrial fibrillation  Assessment & Plan    1. Acute combined LTJ:QZESPQZ presented with progressive DOE. Occasional chest tightness. BNP 1325. HsTrop peaked at 36. EKG with atrial fibrillation  with RVR with non-specific IVCD. CXR with cardiomegaly but no edema. Echo with EF 25-30%. She was diuresed with IV lasix. Weight 208lbs>192lbs. Symptoms improved. She underwent LHC 10/01/2019 which revealed 20% pRCA stenosis, otherwise normal coronaries. Likely cardiomyopathy is tachycardia mediated 2/2 atrial fibrillation with RVR  on presentation. - Will start low dose entresto with plans to uptitrate as tolerated.  -Continuecarvedilol12.5mg BID. If BP room needed for entresto can change to metoprolol succinate - Lasix held as patient with normal pressures on LHC yesterday. Borderline JVD today. With addition of entresto, will hold lasix today, may need dose tomorrow - at discharge, would give PRN lasix 40 mg oral for weight gain 3 lbs overnight or 5 lbs from baseline. - including information in her discharge instructions, reviewed heart failure education today - Continue to monitor strict I&Os and daily weights - Anticipate repeat echocardiogram in 3 months to monitor for improvement in EF. If EF remains low, may need to refer to EP for ICD consideration  2. New onset atrial fibrillation with RVR: HR up to 110s on arrival.HR improved after starting carvedilol, though she still remains in atrial fibrillation. TSH wnl.She is unaware of her current atrial fibrillation.  - Continue carvedilol for rate control.  -This patients CHA2DS2-VASc Score and unadjusted Ischemic Stroke Rate (% per year) is equal to(at least)4.8 % stroke rate/year from a score of 4 Above score calculated as 1 point each if present [CHF, HTN, DM, Vascular=MI/PAD/Aortic Plaque, Age if 65-74, orFemale] Above score calculated as 2 points each if present [Age >75, or Stroke/TIA/TE] - Eliquis initiated following LHC yesterday. Will have 3 doses prior to TEE-CV - Plan for TEE/DCCV tomorrow   3. HTN:BP stable this admission. Patient reports elevated blood pressures at dental appointments for several years. Not on any  medications prior to this admission.  - Managed in the context of #1 and 2  4. Pre-DM:Hgb A1C 6.0 this admission. - Continue to encourage healthy dietary/lifestyle modifications to promote weight loss.   5. HLD: LDL 113 this admission. Patient is very hesitant to start statin therapy as she attributes her fathers dementia to statins. - Recommend aggressive dietary/lifestyle modifications  - Consider omega 3  For questions or updates, please contact [facility] Please consult [email or web address] for contact info under Cardiology/STEMI.      Signed, Johaan Ryser, MD  10/02/2019, 10:14 AM   [phone]  

## 2019-10-02 NOTE — Progress Notes (Signed)
Transitions of Care Pharmacist Note  Evelyn Phelps is a 68 y.o. female that has been diagnosed with atrial fibrillation and will be prescribed Eliquis (apixaban) at discharge.   Patient Education: I provided the following education on Eliquis to the patient: How to take the medication Described what the medication is Signs of bleeding Signs/symptoms of VTE and stroke  Answered their questions  Discharge Medications Plan: The patient wants to have their discharge medications filled by the Transitions of Care pharmacy rather than their usual pharmacy.  The discharge orders pharmacy has been changed to the Transitions of Care pharmacy, the patient will receive a phone call regarding co-pay, and their medications will be delivered by the Transitions of Care pharmacy.    Thank you,   Richardine Service, PharmD PGY1 Pharmacy Resident Phone: [phone] 10/02/2019  5:59 PM

## 2019-10-02 NOTE — Discharge Instructions (Addendum)
Do the following things EVERY DAY:  1) Weigh yourself EVERY morning after you go to the bathroom but before you eat or drink anything. Write this number down in a weight log/diary. If you gain 3 pounds overnight or 5 pounds in a week, take your furosemide (lasix). You can do this daily for up to 3 days.If your weight does not return to your baseline after 3 days, please call the office  2) Take your medicines as prescribed. If you have concerns about your medications, please call us before you stop taking them.   3) Eat low salt foods--Limit salt (sodium) to 2000 mg per day. This will help prevent your body from holding onto fluid. Read food labels as many processed foods have a lot of sodium, especially canned goods and prepackaged meats. If you would like some assistance choosing low sodium foods, we would be happy to set you up with a nutritionist.  4) Stay as active as you can everyday. Staying active will give you more energy and make your muscles stronger. Start with 5 minutes at a time and work your way up to 30 minutes a day. Break up your activities--do some in the morning and some in the afternoon. Start with 3 days per week and work your way up to 5 days as you can.  If you have chest pain, feel short of breath, dizzy, or lightheaded, STOP. If you don't feel better after a short rest, call 911. If you do feel better, call the office to let us know you have symptoms with exercise.  5) Limit all fluids for the day to less than 2 liters. Fluid includes all drinks, coffee, juice, ice chips, soup, jello, and all other liquids.     Information on my medicine - ELIQUIS (apixaban)  Why was Eliquis prescribed for you? Eliquis was prescribed for you to reduce the risk of a blood clot forming that can cause a stroke if you have a medical condition called atrial fibrillation (a type of irregular heartbeat).  What do You need to know about Eliquis ? Take your Eliquis TWICE DAILY - one tablet  in the morning and one tablet in the evening with or without food. If you have difficulty swallowing the tablet whole please discuss with your pharmacist how to take the medication safely.  Take Eliquis exactly as prescribed by your doctor and DO NOT stop taking Eliquis without talking to the doctor who prescribed the medication.  Stopping may increase your risk of developing a stroke.  Refill your prescription before you run out.  After discharge, you should have regular check-up appointments with your healthcare provider that is prescribing your Eliquis.  In the future your dose may need to be changed if your kidney function or weight changes by a significant amount or as you get older.  What do you do if you miss a dose? If you miss a dose, take it as soon as you remember on the same day and resume taking twice daily.  Do not take more than one dose of ELIQUIS at the same time to make up a missed dose.  Important Safety Information A possible side effect of Eliquis is bleeding. You should call your healthcare provider right away if you experience any of the following: ? Bleeding from an injury or your nose that does not stop. ? Unusual colored urine (red or dark brown) or unusual colored stools (red or black). ? Unusual bruising for unknown reasons. ? A serious fall  or if you hit your head (even if there is no bleeding).  Some medicines may interact with Eliquis and might increase your risk of bleeding or clotting while on Eliquis. To help avoid this, consult your healthcare provider or pharmacist prior to using any new prescription or non-prescription medications, including herbals, vitamins, non-steroidal anti-inflammatory drugs (NSAIDs) and supplements.  This website has more information on Eliquis (apixaban): http://www.eliquis.com/eliquis/home    Heart Failure, Diagnosis  Heart failure is a condition in which the heart has trouble pumping blood because it has become weak or  stiff. This means that the heart does not pump blood well enough for the body to stay healthy. For some people with heart failure, fluid may back up into the lungs. There may also be swelling (edema) in the lower legs. Heart failure is usually a long-term (chronic) condition. It is important for you to take good care of yourself and follow the treatment plan from your health care provider. What are the causes? This condition may be caused by:  High blood pressure (hypertension). Hypertension causes the heart muscle to work harder than normal. This makes the heart stiff or weak.  Coronary artery disease, or CAD. CAD is the buildup of cholesterol and fat (plaque) in the arteries of the heart.  Heart attack, also called myocardial infarction. This injures the heart muscle, making it hard for the heart to pump blood.  Abnormal heart valves. The valves do not open and close properly, forcing the heart to pump harder to keep the blood flowing.  Heart muscle disease (cardiomyopathy or myocarditis). This is damage to the heart muscle. It can increase the risk of heart failure.  Lung disease. The heart works harder when the lungs are not healthy.  Abnormal heart rhythms. These can lead to heart failure. What increases the risk? The risk of heart failure increases as a person ages. This condition is also more likely to develop in people who:  Are overweight.  Are female.  Smoke or chew tobacco.  Abuse alcohol or illegal drugs.  Have taken medicines that can damage the heart, such as chemotherapy drugs.  Have diabetes.  Have abnormal heart rhythms.  Have thyroid problems.  Have low blood counts (anemia). What are the signs or symptoms? Symptoms of this condition include:  Shortness of breath with activity, such as when climbing stairs.  A cough that does not go away.  Swelling of the feet, ankles, legs, or abdomen.  Losing weight for no reason.  Trouble breathing when lying flat  (orthopnea).  Waking from sleep because of the need to sit up and get more air.  Rapid heartbeat.  Tiredness (fatigue) and loss of energy.  Feeling light-headed, dizzy, or close to fainting.  Loss of appetite.  Nausea.  Waking up more often during the night to urinate (nocturia).  Confusion. How is this diagnosed? This condition is diagnosed based on:  Your medical history, symptoms, and a physical exam.  Diagnostic tests, which may include: ? Echocardiogram. ? Electrocardiogram (ECG). ? Chest X-ray. ? Blood tests. ? Exercise stress test. ? Radionuclide scans. ? Cardiac catheterization and angiogram. How is this treated? Treatment for this condition is aimed at managing the symptoms of heart failure. Medicines Treatment may include medicines that:  Help lower blood pressure by relaxing (dilating) the blood vessels. These medicines are called ACE inhibitors (angiotensin-converting enzyme) and ARBs (angiotensin receptor blockers).  Cause the kidneys to remove salt and water from the blood through urination (diuretics).  Improve heart muscle  strength and prevent the heart from beating too fast (beta blockers).  Increase the force of the heartbeat (digoxin). Healthy behavior changes     Treatment may also include making healthy lifestyle changes, such as:  Reaching and staying at a healthy weight.  Quitting smoking or chewing tobacco.  Eating heart-healthy foods.  Limiting or avoiding alcohol.  Stopping the use of illegal drugs.  Being physically active.  Other treatments Other treatments may include:  Procedures to open blocked arteries or repair damaged valves.  Placing a pacemaker to improve heart function (cardiac resynchronization therapy).  Placing a device to treat serious abnormal heart rhythms (implantable cardioverter defibrillator, or ICD).  Placing a device to improve the pumping ability of the heart (left ventricular assist device, or  LVAD).  Receiving a healthy heart from a donor (heart transplant). This is done when other treatments have not helped. Follow these instructions at home:  Manage other health conditions as told by your health care provider. These may include hypertension, diabetes, thyroid disease, or abnormal heart rhythms.  Get ongoing education and support as needed. Learn as much as you can about heart failure.  Keep all follow-up visits as told by your health care provider. This is important. Summary  Heart failure is a condition in which the heart has trouble pumping blood because it has become weak or stiff.  This condition is caused by high blood pressure and other diseases of the heart and lungs.  Symptoms of this condition include shortness of breath, tiredness (fatigue), nausea, and swelling of the feet, ankles, legs, or abdomen.  Treatments for this condition may include medicines, lifestyle changes, and surgery.  Manage other health conditions as told by your health care provider. This information is not intended to replace advice given to you by your health care provider. Make sure you discuss any questions you have with your health care provider. Document Released: 12/04/2005 Document Revised: 02/21/2019 Document Reviewed: 02/21/2019 Elsevier Patient Education  [place].

## 2019-10-02 NOTE — H&P (View-Only) (Signed)
Progress Note  Patient Name: Evelyn Phelps Date of Encounter: 10/02/2019  Primary Cardiologist: Buford Dresser, MD   Subjective   Did not sleep well overnight, couldn't get comfortable. No chest pain. We spent significant time today reviewing medications, how they work, how they help the heart. Also discussed cath results again as well as TEE-CV planned for tomorrow. She is anxious but all questions answered.  Inpatient Medications    Scheduled Meds: . apixaban  5 mg Oral BID  . carvedilol  12.5 mg Oral BID WC  . insulin aspart  0-9 Units Subcutaneous TID WC  . sacubitril-valsartan  1 tablet Oral BID  . sodium chloride flush  3 mL Intravenous Q12H  . sodium chloride flush  3 mL Intravenous Q12H   Continuous Infusions: . sodium chloride    . sodium chloride Stopped (10/02/19 0940)   PRN Meds: sodium chloride, acetaminophen, ondansetron (ZOFRAN) IV, sodium chloride flush, sodium chloride flush   Vital Signs    Vitals:   10/02/19 0412 10/02/19 0632 10/02/19 0816 10/02/19 0837  BP: (!) 142/82  129/77   Pulse: 70  66   Resp: 20  (!) 7 18  Temp: 98 F (36.7 C)  97.9 F (36.6 C)   TempSrc: Oral  Oral   SpO2: 95%  94%   Weight:  87.1 kg    Height:        Intake/Output Summary (Last 24 hours) at 10/02/2019 1014 Last data filed at 10/02/2019 0837 Gross per 24 hour  Intake 240 ml  Output 950 ml  Net -710 ml   Filed Weights   10/01/19 1000 10/01/19 1749 10/02/19 9147  Weight: 87.2 kg 87.1 kg 87.1 kg    Telemetry    Atrial fibrillation - Personally Reviewed  Physical Exam   GEN: No acute distress.   Neck: JVD just at clavicle at 60 degrees Cardiac: irregularly irregular S1 and S2, no murmurs, rubs, or gallops.  Respiratory: Clear to auscultation bilaterally GI: NABS, Soft, nontender, non-distended  MS: No edema; No deformity. Neuro:  Nonfocal, moving all extremities spontaneously Psych: Normal affect   Labs    Chemistry Recent Labs  Lab 09/28/19  1823 09/28/19 2131  09/30/19 0521 09/30/19 1842 10/01/19 0519 10/02/19 0446  NA 141 141   < > 142  --  141 142  K 3.0* 3.6   < > 3.2* 3.9 3.5 4.4  CL 102 103   < > 101  --  102 106  CO2 24 27   < > 29  --  28 26  GLUCOSE 148* 125*   < > 103*  --  104* 110*  BUN 14 14   < > 21  --  23 22  CREATININE 0.92 0.87   < > 0.86  --  0.89 0.94  CALCIUM 9.1 8.8*   < > 8.4*  --  8.6* 8.8*  PROT 7.2 5.9*  --   --   --   --   --   ALBUMIN 4.1 3.6  --   --   --   --   --   AST 33 33  --   --   --   --   --   ALT 40 38  --   --   --   --   --   ALKPHOS 77 68  --   --   --   --   --   BILITOT 1.4* 1.4*  --   --   --   --   --  GFRNONAA >60 >60   < > >60  --  >60 >60  GFRAA >60 >60   < > >60  --  >60 >60  ANIONGAP 15 11   < > 12  --  11 10   < > = values in this interval not displayed.     Hematology Recent Labs  Lab 09/29/19 0516 09/30/19 0521 10/01/19 0519  WBC 6.3 5.7 5.6  RBC 3.94 4.24 4.32  HGB 12.6 13.6 13.9  HCT 39.4 42.5 43.7  MCV 100.0 100.2* 101.2*  MCH 32.0 32.1 32.2  MCHC 32.0 32.0 31.8  RDW 13.6 13.8 13.6  PLT 179 184 189    Cardiac EnzymesNo results for input(s): TROPONINI in the last 168 hours. No results for input(s): TROPIPOC in the last 168 hours.   BNP Recent Labs  Lab 09/28/19 1828  BNP 1,325.1*     DDimer No results for input(s): DDIMER in the last 168 hours.   Radiology    No results found.  Cardiac Studies   Echocardiogram 09/29/2019: 1. Left ventricular ejection fraction, by visual estimation, is 25 to 30%. The left ventricle has severely decreased function. Severely increased left ventricular size. There is mildly increased left ventricular hypertrophy. 2. Left ventricular diastolic function could not be evaluated pattern of LV diastolic filling. 3. Global right ventricle has normal systolic function.The right ventricular size is normal. 4. Left atrial size was normal. 5. Right atrial size was normal. 6. Mild mitral annular  calcification. 7. The mitral valve is abnormal. Mild mitral valve regurgitation. No evidence of mitral stenosis. 8. The tricuspid valve is normal in structure. Tricuspid valve regurgitation is mild. 9. The aortic valve is tricuspid Aortic valve regurgitation is trivial by color flow Doppler. Mild aortic valve sclerosis without stenosis. 10. The pulmonic valve was not well visualized. Pulmonic valve regurgitation is not visualized by color flow Doppler. 11. Mildly elevated pulmonary artery systolic pressure. 12. The inferior vena cava is dilated in size with <50% respiratory variability, suggesting right atrial pressure of 15 mmHg. 13. Akinesis of the anteroseptal, apical and inferior walls; overall severely reduced LV systolic function; severe LVE; mild LVH; trace AI; mild MR and TR.  Left heart catheterization 10/01/2019:  Prox RCA lesion is 20% stenosed.  There is severe left ventricular systolic dysfunction.  LV end diastolic pressure is normal.  The left ventricular ejection fraction is 25-35% by visual estimate.   1.  Mild stenosis in a small nondominant right coronary artery.  Otherwise normal coronary arteries. 2.  Severely reduced LV systolic function. 3.  Normal left ventricular end-diastolic pressure.  Recommendations: The patient has nonischemic cardiomyopathy likely tachycardia induced. Recommend medical therapy for cardiomyopathy. Consider TEE guided cardioversion tomorrow.  I am going to start the patient on Eliquis 5 mg twice daily to be started this evening.  Patient Profile     68 y.o.femalewho has not been seen by a medical provider in quite some time, former tobacco abuse (quit in 2018),who is being followed by cardiology for the evaluation ofacute combined CHF and atrial fibrillation  Assessment & Plan    1. Acute combined LTJ:QZESPQZ presented with progressive DOE. Occasional chest tightness. BNP 1325. HsTrop peaked at 36. EKG with atrial fibrillation  with RVR with non-specific IVCD. CXR with cardiomegaly but no edema. Echo with EF 25-30%. She was diuresed with IV lasix. Weight 208lbs>192lbs. Symptoms improved. She underwent LHC 10/01/2019 which revealed 20% pRCA stenosis, otherwise normal coronaries. Likely cardiomyopathy is tachycardia mediated 2/2 atrial fibrillation with RVR  on presentation. - Will start low dose entresto with plans to uptitrate as tolerated.  -Continuecarvedilol12.5mg  BID. If BP room needed for entresto can change to metoprolol succinate - Lasix held as patient with normal pressures on LHC yesterday. Borderline JVD today. With addition of entresto, will hold lasix today, may need dose tomorrow - at discharge, would give PRN lasix 40 mg oral for weight gain 3 lbs overnight or 5 lbs from baseline. - including information in her discharge instructions, reviewed heart failure education today - Continue to monitor strict I&Os and daily weights - Anticipate repeat echocardiogram in 3 months to monitor for improvement in EF. If EF remains low, may need to refer to EP for ICD consideration  2. New onset atrial fibrillation with RVR: HR up to 110s on arrival.HR improved after starting carvedilol, though she still remains in atrial fibrillation. TSH wnl.She is unaware of her current atrial fibrillation.  - Continue carvedilol for rate control.  -This patients CHA2DS2-VASc Score and unadjusted Ischemic Stroke Rate (% per year) is equal to(at least)4.8 % stroke rate/year from a score of 4 Above score calculated as 1 point each if present [CHF, HTN, DM, Vascular=MI/PAD/Aortic Plaque, Age if 65-74, orFemale] Above score calculated as 2 points each if present [Age >75, or Stroke/TIA/TE] - Eliquis initiated following LHC yesterday. Will have 3 doses prior to TEE-CV - Plan for TEE/DCCV tomorrow   3. HTN:BP stable this admission. Patient reports elevated blood pressures at dental appointments for several years. Not on any  medications prior to this admission.  - Managed in the context of #1 and 2  4. Pre-DM:Hgb A1C 6.0 this admission. - Continue to encourage healthy dietary/lifestyle modifications to promote weight loss.   5. HLD: LDL 113 this admission. Patient is very hesitant to start statin therapy as she attributes her fathers dementia to statins. - Recommend aggressive dietary/lifestyle modifications  - Consider omega 3  For questions or updates, please contact [facility] Please consult [email or web address] for contact info under Cardiology/STEMI.      Signed, Jodelle Red, MD  10/02/2019, 10:14 AM   [phone]

## 2019-10-03 ENCOUNTER — Inpatient Hospital Stay (HOSPITAL_COMMUNITY): Payer: Medicare Other | Admitting: Certified Registered Nurse Anesthetist

## 2019-10-03 ENCOUNTER — Telehealth: Payer: Self-pay | Admitting: Cardiology

## 2019-10-03 ENCOUNTER — Encounter (HOSPITAL_COMMUNITY): Payer: Self-pay

## 2019-10-03 ENCOUNTER — Inpatient Hospital Stay (HOSPITAL_COMMUNITY): Payer: Medicare Other

## 2019-10-03 ENCOUNTER — Encounter (HOSPITAL_COMMUNITY)
Admission: EM | Disposition: A | Discharge status: Home / Self Care | Payer: Self-pay | Source: Home / Self Care | Attending: Cardiology

## 2019-10-03 DIAGNOSIS — I4891 Unspecified atrial fibrillation: Secondary | ICD-10-CM

## 2019-10-03 DIAGNOSIS — I361 Nonrheumatic tricuspid (valve) insufficiency: Secondary | ICD-10-CM

## 2019-10-03 DIAGNOSIS — I340 Nonrheumatic mitral (valve) insufficiency: Secondary | ICD-10-CM

## 2019-10-03 HISTORY — PX: CARDIOVERSION: SHX1299

## 2019-10-03 HISTORY — PX: TEE WITHOUT CARDIOVERSION: SHX5443

## 2019-10-03 LAB — GLUCOSE, CAPILLARY
Glucose-Capillary: 122 mg/dL — ABNORMAL HIGH (ref 70–99)
Glucose-Capillary: 111 mg/dL — ABNORMAL HIGH (ref 70–99)

## 2019-10-03 SURGERY — ECHOCARDIOGRAM, TRANSESOPHAGEAL
Anesthesia: General

## 2019-10-03 MED ORDER — CARVEDILOL 12.5 MG PO TABS: 12.5000 mg | ORAL_TABLET | Freq: Two times a day (BID) | ORAL | 5 refills | Status: DC

## 2019-10-03 MED ORDER — APIXABAN 5 MG PO TABS: 5.0000 mg | ORAL_TABLET | Freq: Two times a day (BID) | ORAL | 11 refills | Status: DC

## 2019-10-03 MED ORDER — SACUBITRIL-VALSARTAN 24-26 MG PO TABS: 1.0000 | ORAL_TABLET | Freq: Two times a day (BID) | ORAL | 1 refills | Status: DC

## 2019-10-03 MED ORDER — SACUBITRIL-VALSARTAN 24-26 MG PO TABS: 1.0000 | ORAL_TABLET | Freq: Two times a day (BID) | ORAL | 0 refills | Status: DC

## 2019-10-03 MED ORDER — SODIUM CHLORIDE 0.9 % IV SOLN
INTRAVENOUS | Status: DC | PRN
  Administered 2019-10-03: 20 ug/min via INTRAVENOUS

## 2019-10-03 MED ORDER — PROPOFOL 500 MG/50ML IV EMUL
INTRAVENOUS | Status: DC | PRN
  Administered 2019-10-03: 75 ug/kg/min via INTRAVENOUS

## 2019-10-03 MED ORDER — PROPOFOL 10 MG/ML IV BOLUS
INTRAVENOUS | Status: DC | PRN
  Administered 2019-10-03: 40 mg via INTRAVENOUS
  Administered 2019-10-03: 20 mg via INTRAVENOUS

## 2019-10-03 MED ORDER — APIXABAN 5 MG PO TABS: 5.0000 mg | ORAL_TABLET | Freq: Two times a day (BID) | ORAL | 0 refills | Status: DC

## 2019-10-03 MED FILL — ELIQUIS 5 MG TABLET: 5 | 30 days supply | Qty: 60 | Fill #0

## 2019-10-03 MED FILL — CARVEDILOL 12.5 MG TABLET: 12.5 | 30 days supply | Qty: 60 | Fill #0

## 2019-10-03 MED FILL — ENTRESTO 24 MG-26 MG TABLET: 24-26 | 30 days supply | Qty: 60 | Fill #0

## 2019-10-03 NOTE — Progress Notes (Signed)
Day Shift was able to obtain Consent however the procedure written was Direct  current Cardioversion. Order was TEE/cardioversion. New consent  was made however, when pt was asked about it, she stated " they might have explained it but what do you think?" seems pt did not fully understand the procedure. pt needs further explanation about it and she agreed to sign once explained. Consent attatched to Pt chart and ready to be sign.

## 2019-10-03 NOTE — Discharge Summary (Addendum)
Discharge Summary    Patient ID: LYNZE REDDY MRN: [id number]; DOB: December 13, 1951  Admit date: 09/28/2019 Discharge date: 10/03/2019  Primary Care Provider: Patient, No Pcp Per  Primary Cardiologist: Buford Dresser, MD Primary Electrophysiologist:  None   Discharge Diagnoses    Principal Problem:   Acute combined systolic and diastolic congestive heart failure ([place]) Active Problems:   New onset of congestive heart failure ([place])   Atrial fibrillation with RVR ([place])   Hypokalemia   Prediabetes   Allergies Allergies  Allergen Reactions  . Erythromycin Rash    Diagnostic Studies/Procedures    Echo 09/29/2019 IMPRESSIONS   1. Left ventricular ejection fraction, by visual estimation, is 25 to 30%. The left ventricle has severely decreased function. Severely increased left ventricular size. There is mildly increased left ventricular hypertrophy.  2. Left ventricular diastolic function could not be evaluated pattern of LV diastolic filling.  3. Global right ventricle has normal systolic function.The right ventricular size is normal.  4. Left atrial size was normal.  5. Right atrial size was normal.  6. Mild mitral annular calcification.  7. The mitral valve is abnormal. Mild mitral valve regurgitation. No evidence of mitral stenosis.  8. The tricuspid valve is normal in structure. Tricuspid valve regurgitation is mild.  9. The aortic valve is tricuspid Aortic valve regurgitation is trivial by color flow Doppler. Mild aortic valve sclerosis without stenosis. 10. The pulmonic valve was not well visualized. Pulmonic valve regurgitation is not visualized by color flow Doppler. 11. Mildly elevated pulmonary artery systolic pressure. 12. The inferior vena cava is dilated in size with <50% respiratory variability, suggesting right atrial pressure of 15 mmHg. 13. Akinesis of the anteroseptal, apical and inferior walls; overall severely reduced LV systolic function; severe LVE;  mild LVH; trace AI; mild MR and TR.   Echo 10/01/2019  Prox RCA lesion is 20% stenosed.  There is severe left ventricular systolic dysfunction.  LV end diastolic pressure is normal.  The left ventricular ejection fraction is 25-35% by visual estimate.   1.  Mild stenosis in a small nondominant right coronary artery.  Otherwise normal coronary arteries. 2.  Severely reduced LV systolic function. 3.  Normal left ventricular end-diastolic pressure.  Recommendations: The patient has nonischemic cardiomyopathy likely tachycardia induced. Recommend medical therapy for cardiomyopathy. Consider TEE guided cardioversion tomorrow.  I am going to start the patient on Eliquis 5 mg twice daily to be started this evening.  _____________   History of Present Illness     Evelyn Phelps is a 68 y.o. female with no significant PMH other than former tobacco abuse (quit in 2018) presented with DOE. Ms. Reiland was in her usual state of health until a few weeks ago when she began experiencing DOE. She would notice SOB while gardening or walking. She reported bendopnea. Also occasional chest tightness which she stated felt like her prior episode of PNA 2 years ago, with decreased exercise tolerance since that time. She reported occasional palpitations which last for seconds to minutes. She experienced LE edema. Her SOB progressively worsened prompting her to present to the ED for further evaluation.   She does not following with medical providers regularly. No prior cardiac testing. She denies family history of heart disease. She reports drinking etoh socially. She quit smoking 2 years ago after her episode of PNA. She denies recreational drug use. She reports being told her blood pressure was elevated on several dental appointments in recent years but no formal HTN diagnosis. No history  of HLD, though she blames her father's dementia on statin use and is very hesitant to consider statin therapy if her  cholesterol is elevated on labs this admission.   At the time of this evaluation she reports improvement in her breathing and LE edema. Still with some orthopnea but no PND. No complaints of dizziness, lightheadedness, or syncope. Prior to a few weeks ago she denied any anginal complaints. She denies recent viral illnesses. No fever, cough, nausea, vomiting, diarrhea.   Hospital course: tachycardic to 110s on admission, improved to 80s, persistently hypertensive, otherwise VSS. Labs notable for hypokalemia (3.2 today), Cr 0.92>0.86, CBC wnl, BNP 1325, Trop 36>30, Hgb A1C 6.0, TSH wnl. EKG with coarse atrial fibrillation with rate 117, PVC, non-specific IVCD, no comparison available. CXR with cardiomegaly without edema. Echo with EF 25-30%, mild LVH, severely dilated LV, normal RV size/function, normal LA size, mild MR/TR, and mildly elevated PA pressures. She was started on carvedilol for rate control/CHF and captopril per Cardiology fellow recommendations. Heparin gtt started for stroke ppx. She was diuresed with IV lasix 40mg  daily with UOP net -1.9L in the past 24 hours and -2.8L this admission. Cardiology asked to evaluate for new onset CHF and Afib.   Hospital Course     Consultants: N/A  Patient was initially admitted by the hospitalist service, cardiology service was consulted for LV dysfunction and atrial fibrillation with RVR.  Echocardiogram obtained on 09/29/2019 showed EF 25 to 30%, akinesis of the anteroseptal, apical and inferior wall.  She underwent IV diuresis.  Cardiac catheterization performed on 10/01/2019 showed EF 25 to 35%, 20% proximal RCA lesion, otherwise no significant coronary artery disease.  Her LV dysfunction was felt to be due to tachycardia mediated cardiomyopathy.  She was placed on Eliquis, carvedilol and Entresto and eventually underwent TEE cardioversion on 10/03/2019.  She was able to maintain normal sinus rhythm for 2 hours after the cardioversion.  She will be  discharged on Lasix 40 mg as needed for weight gain of greater than 3lbs overnight or 5 lbs in a single week.  I will arrange outpatient follow-up.  Her heart failure medication will continue to be uptitrated.  Anticipate repeat echocardiogram in 3 months to reassess ejection fraction.  Hemoglobin A1c was 6.0 during this admission which placed the patient in the range of prediabetes.  LDL was also elevated, however patient was very hesitant to start on the statin therapy as she attributed her father's dementia to statins.  Therefore at this time, we recommend aggressive dietary and lifestyle modification.   Physical exam: GEN:No acute distress.   Neck:normal Cardiac: irregularly irregular S1 and S2, no murmurs, rubs, or gallops.  Respiratory:Clear to auscultation bilaterally 10/05/2019, Soft, nontender, non-distended  MS:No edema; No deformity. Neuro:Nonfocal, moving all extremities spontaneously Psych: Normal affect    Did the patient have an acute coronary syndrome (MI, NSTEMI, STEMI, etc) this admission?:  No                               Did the patient have a percutaneous coronary intervention (stent / angioplasty)?:  No.   _____________  Discharge Vitals Blood pressure 120/67, pulse 65, temperature (!) 97.3 F (36.3 C), temperature source Temporal, resp. rate 19, height 5\' 6"  (1.676 m), weight 87.1 kg, SpO2 96 %.  Filed Weights   10/01/19 1749 10/02/19 0632 10/03/19 0500  Weight: 87.1 kg 87.1 kg 87.1 kg    Labs & Radiologic Studies  CBC Recent Labs    10/01/19 0519  WBC 5.6  HGB 13.9  HCT 43.7  MCV 101.2*  PLT 189   Basic Metabolic Panel Recent Labs    65/53/74 0519 10/02/19 0446  NA 141 142  K 3.5 4.4  CL 102 106  CO2 28 26  GLUCOSE 104* 110*  BUN 23 22  CREATININE 0.89 0.94  CALCIUM 8.6* 8.8*   Liver Function Tests No results for input(s): AST, ALT, ALKPHOS, BILITOT, PROT, ALBUMIN in the last 72 hours. No results for input(s): LIPASE, AMYLASE in the  last 72 hours. High Sensitivity Troponin:   Recent Labs  Lab 09/28/19 1823 09/28/19 1950 09/29/19 0209  TROPONINIHS 36* 36* 30*    BNP Invalid input(s): POCBNP D-Dimer No results for input(s): DDIMER in the last 72 hours. Hemoglobin A1C No results for input(s): HGBA1C in the last 72 hours. Fasting Lipid Panel No results for input(s): CHOL, HDL, LDLCALC, TRIG, CHOLHDL, LDLDIRECT in the last 72 hours. Thyroid Function Tests No results for input(s): TSH, T4TOTAL, T3FREE, THYROIDAB in the last 72 hours.  Invalid input(s): FREET3 _____________  Dg Chest 2 View  Result Date: 09/28/2019 CLINICAL DATA:  Increasing shortness of breath over the past 2 years, worse over the past week. EXAM: CHEST - 2 VIEW COMPARISON:  None. FINDINGS: There is cardiomegaly without edema. Very small bilateral pleural effusions and mild basilar atelectasis are seen. No pneumothorax. IMPRESSION: Cardiomegaly without edema. Very small bilateral pleural effusions and mild basilar atelectasis. Electronically Signed   By: Drusilla Kanner M.D.   On: 09/28/2019 13:59   Disposition   Pt is being discharged home today in good condition.  Follow-up Plans & Appointments    Follow-up Information    Jodelle Gross, NP Follow up on 10/14/2019.   Specialties: Nurse Practitioner, Radiology, Cardiology Why: 9:15AM. Cardiology followup with Dr. Di Kindle NP Contact information: [address] [place] [address] [phone]          Discharge Instructions    Diet - low sodium heart healthy   Complete by: As directed    Increase activity slowly   Complete by: As directed       Discharge Medications   Allergies as of 10/03/2019      Reactions   Erythromycin Rash      Medication List    STOP taking these medications   ibuprofen 200 MG tablet Commonly known as: ADVIL     TAKE these medications   apixaban 5 MG Tabs tablet Commonly known as: ELIQUIS Take 1 tablet (5 mg  total) by mouth 2 (two) times daily.   carvedilol 12.5 MG tablet Commonly known as: COREG Take 1 tablet (12.5 mg total) by mouth 2 (two) times daily with a meal.   sacubitril-valsartan 24-26 MG Commonly known as: ENTRESTO Take 1 tablet by mouth 2 (two) times daily.          Outstanding Labs/Studies   None  Duration of Discharge Encounter   Greater than 30 minutes including physician time.  Ramond Dial, PA 10/03/2019, 3:06 PM

## 2019-10-03 NOTE — Transfer of Care (Signed)
Immediate Anesthesia Transfer of Care Note  Patient: Evelyn Phelps  Procedure(s) Performed: TRANSESOPHAGEAL ECHOCARDIOGRAM (TEE) (N/A ) CARDIOVERSION (N/A )  Patient Location: Endoscopy Unit  Anesthesia Type:MAC  Level of Consciousness: drowsy and patient cooperative  Airway & Oxygen Therapy: Patient Spontanous Breathing and Patient connected to nasal cannula oxygen  Post-op Assessment: Report given to RN and Post -op Vital signs reviewed and stable  Post vital signs: Reviewed and stable  Last Vitals:  Vitals Value Taken Time  BP    Temp    Pulse    Resp    SpO2      Last Pain:  Vitals:   10/03/19 0935  TempSrc: Temporal  PainSc: 0-No pain         Complications: No apparent anesthesia complications

## 2019-10-03 NOTE — Progress Notes (Signed)
  Echocardiogram Echocardiogram Transesophageal has been performed.  Evelyn Phelps 10/03/2019, 11:25 AM

## 2019-10-03 NOTE — Telephone Encounter (Signed)
Patient currently admitted

## 2019-10-03 NOTE — Anesthesia Procedure Notes (Signed)
Procedure Name: MAC Date/Time: 10/03/2019 10:43 AM Performed by: Lowella Dell, CRNA Pre-anesthesia Checklist: Patient identified, Emergency Drugs available, Suction available, Patient being monitored and Timeout performed Patient Re-evaluated:Patient Re-evaluated prior to induction Oxygen Delivery Method: Nasal cannula Induction Type: IV induction Placement Confirmation: positive ETCO2 Dental Injury: Teeth and Oropharynx as per pre-operative assessment

## 2019-10-03 NOTE — CV Procedure (Signed)
   TRANSESOPHAGEAL ECHOCARDIOGRAM GUIDED DIRECT CURRENT CARDIOVERSION  NAME:  Evelyn Phelps   MRN: [id number] DOB:  03/22/1951   ADMIT DATE: 09/28/2019  INDICATIONS: Symptomatic atrial fibrillation  PROCEDURE:   Informed consent was obtained prior to the procedure. The risks, benefits and alternatives for the procedure were discussed and the patient comprehended these risks.  Risks include, but are not limited to, cough, sore throat, vomiting, nausea, somnolence, esophageal and stomach trauma or perforation, bleeding, low blood pressure, aspiration, pneumonia, infection, trauma to the teeth and death.    After a procedural time-out, the oropharynx was anesthetized and the patient was sedated by the anesthesia service. The transesophageal probe was inserted in the esophagus and stomach without difficulty and multiple views were obtained. Anesthesia was monitored by Dr. Ola Spurr.   COMPLICATIONS:    Complications: No complications Patient tolerated procedure well.  FINDINGS:  LEFT VENTRICLE: EF = 70-26%, severe systolic dysfunction  RIGHT VENTRICLE: Normal size and function.   LEFT ATRIUM: No thrombus/mass.  LEFT ATRIAL APPENDAGE: No thrombus/mass.   RIGHT ATRIUM: No thrombus/mass.  AORTIC VALVE:  Trileaflet. Mild regurgitation. No vegetation.  MITRAL VALVE:    Normal structure. Mild regurgitation. No vegetation.  TRICUSPID VALVE: Normal structure. No regurgitation. No vegetation.  PULMONIC VALVE: Grossly normal structure. No regurgitation. No apparent vegetation.  INTERATRIAL SEPTUM: Small PFO  PERICARDIUM: Trivial effusion noted.  DESCENDING AORTA:Mild plaque seen   CARDIOVERSION:     Indications:  Symptomatic Atrial Fibrillation  Procedure Details:  Once the TEE was complete, the patient had the defibrillator pads placed in the anterior and posterior position. Once an appropriate level of sedation was confirmed, the patient was cardioverted x 1 with 200J of  biphasic synchronized energy.  The patient converted to NSR.  There were no apparent complications.  The patient had normal neuro status and respiratory status post procedure with vitals stable as recorded elsewhere.  Adequate airway was maintained throughout and vital signs monitored per protocol.  Oswaldo Milian MD [facility]  [address], [place], [place] [address] [phone]   4:08 PM

## 2019-10-03 NOTE — Anesthesia Preprocedure Evaluation (Addendum)
Anesthesia Evaluation  Patient identified by MRN, date of birth, ID band Patient awake    Reviewed: Allergy & Precautions, H&P , NPO status , Patient's Chart, lab work & pertinent test results  Airway Mallampati: II  TM Distance: >3 FB Neck ROM: Full    Dental no notable dental hx. (+) Partial Upper, Dental Advisory Given   Pulmonary neg pulmonary ROS, former smoker,    Pulmonary exam normal breath sounds clear to auscultation       Cardiovascular +CHF  + dysrhythmias Atrial Fibrillation  Rhythm:Irregular Rate:Normal     Neuro/Psych negative neurological ROS  negative psych ROS   GI/Hepatic negative GI ROS, Neg liver ROS,   Endo/Other  negative endocrine ROS  Renal/GU negative Renal ROS  negative genitourinary   Musculoskeletal   Abdominal   Peds  Hematology negative hematology ROS (+)   Anesthesia Other Findings   Reproductive/Obstetrics negative OB ROS                            Anesthesia Physical Anesthesia Plan  ASA: III  Anesthesia Plan: General   Post-op Pain Management:    Induction: Intravenous  PONV Risk Score and Plan: 3 and Propofol infusion and Treatment may vary due to age or medical condition  Airway Management Planned: Nasal Cannula  Additional Equipment:   Intra-op Plan:   Post-operative Plan:   Informed Consent: I have reviewed the patients History and Physical, chart, labs and discussed the procedure including the risks, benefits and alternatives for the proposed anesthesia with the patient or authorized representative who has indicated his/her understanding and acceptance.     Dental advisory given  Plan Discussed with: CRNA  Anesthesia Plan Comments:         Anesthesia Quick Evaluation

## 2019-10-03 NOTE — TOC Benefit Eligibility Note (Signed)
Transition of Care [facility]) Benefit Eligibility Note    Patient Details  Name: MARLEI GLOMSKI MRN: [id number] Date of Birth: 1951-06-05   Medication/Dose: ELIQUIS 2.5 MG BID   AND  ELIQUIS  5 MG BID  Covered?: Yes  Tier: 3 Drug  Prescription Coverage [facility]erred Pharmacy: [facility]  Spoke with Person/Company/Phone Number:: [id number]   @ [employer] # [phone]  Co-Pay: $38.70  Prior Approval: No  Deductible: Met  Additional Notes: ENTRESTO  24-26 MG BID , COVER-YES, CO-PAY- $38.70, TIER- 3 DRUG , P/A -NO    Memory Argue Phone Number: 10/03/2019, 4:20 PM

## 2019-10-03 NOTE — Interval H&P Note (Signed)
History and Physical Interval Note:  10/03/2019 9:57 AM  Evelyn Phelps  has presented today for surgery, with the diagnosis of AFIB.  The various methods of treatment have been discussed with the patient and family. After consideration of risks, benefits and other options for treatment, the patient has consented to  Procedure(s): TRANSESOPHAGEAL ECHOCARDIOGRAM (TEE) (N/A) CARDIOVERSION (N/A) as a surgical intervention.  The patient's history has been reviewed, patient examined, no change in status, stable for surgery.  I have reviewed the patient's chart and labs.  Questions were answered to the patient's satisfaction.     Donato Heinz

## 2019-10-03 NOTE — Telephone Encounter (Signed)
New message   Per Buford Dresser setup TOC with Jory Sims on 10/14/19 at 9:15 am.

## 2019-10-03 NOTE — Anesthesia Postprocedure Evaluation (Signed)
Anesthesia Post Note  Patient: Evelyn Phelps  Procedure(s) Performed: TRANSESOPHAGEAL ECHOCARDIOGRAM (TEE) (N/A ) CARDIOVERSION (N/A )     Patient location during evaluation: Endoscopy Anesthesia Type: General Level of consciousness: awake and alert Pain management: pain level controlled Vital Signs Assessment: post-procedure vital signs reviewed and stable Respiratory status: spontaneous breathing, nonlabored ventilation and respiratory function stable Cardiovascular status: blood pressure returned to baseline and stable Postop Assessment: no apparent nausea or vomiting Anesthetic complications: no    Last Vitals:  Vitals:   10/03/19 1140 10/03/19 1158  BP: (!) 111/55 120/67  Pulse: 63 65  Resp: 19   Temp:    SpO2: 96%     Last Pain:  Vitals:   10/03/19 1140  TempSrc:   PainSc: 0-No pain                 Clide Remmers,W. EDMOND

## 2019-10-03 NOTE — TOC Progression Note (Addendum)
Transition of Care [facility]) - Progression Note    Patient Details  Name: Evelyn Phelps MRN: [id number] Date of Birth: 23-Aug-1951  [facility] CM/SW Contact  Zenon Mayo, RN Phone Number: 10/03/2019, 3:53 PM  Clinical Narrative:    Patient will be on Eliquis and Entresto, NCM awaiting benefit check.  [facility] will  Fill the 30 day free for Eliquis/Entresto.  NCM will notify patient of the copay amt from benefit check when received.  Her cell is [phone]. NCM contacted patient and gave her the copay amt for her refills.        Expected Discharge Plan and Services           Expected Discharge Date: 10/03/19                                     Social Determinants of Health (SDOH) Interventions    Readmission Risk Interventions No flowsheet data found.

## 2019-10-05 ENCOUNTER — Encounter (HOSPITAL_COMMUNITY): Payer: Self-pay | Admitting: Cardiology

## 2019-10-07 NOTE — Telephone Encounter (Signed)
Attempted to contact patient to discuss [facility] appointment information. LVM with call back number.

## 2019-10-08 NOTE — Telephone Encounter (Signed)
Patient contacted regarding discharge from [facility] on 10/03/2019.  Patient understands to follow up with provider Curt Bears NP on 10/14/19 at [place] [facility]. Patient understands discharge instructions? YES - felt as if instructions were not well-explained; review heart healthy, low sodium diet, advised to monitor for HF s/sx, weigh daily, check BP/HR if able, bring vital sign log to appointment Patient understands medications and regiment? YES Patient understands to bring all medications to this visit? YES

## 2019-10-08 NOTE — Telephone Encounter (Signed)
Follow up ° ° °Patient is returning call per the previous message. Please call. °

## 2019-10-08 NOTE — Telephone Encounter (Signed)
LMTCB

## 2019-10-13 NOTE — Progress Notes (Signed)
Cardiology Office Note   Date:  10/14/2019   ID:  Evelyn Phelps, DOB [birth year], MRN [id number]  PCP:  Patient, No Pcp Per  Cardiologist: Dr. Harrell Gave No chief complaint on file.    History of Present Illness: Evelyn Phelps is a 68 y.o. female who presents for [facility] posthospitalization follow-up after admission for acute combined systolic diastolic heart failure, atrial fibrillation with RVR, and hypokalemia.  She also has history of former tobacco abuse but quit in 2018.  She presented to the hospital with complaints of dyspnea on exertion which was becoming progressively worse.  She did not have any prior cardiac history.  On admission the patient was tachycardic, hypokalemic with a potassium 3.2, found to have coarse atrial fibrillation with a rate of 117 bpm.  She was not found to have any pneumonia or pulmonary edema.  Echocardiogram was completed revealing an EF of 25% to 30%, mild LVH with severely dilated LV and normal RV size and function with normal LA size and mild MR/TR with mildly elevated PA pressures.  After IV diuresis, the patient was scheduled for cardiac catheterization which was performed on 10/01/2019 which confirmed EF of 25% to 35% with a 20% proximal RCA lesion otherwise no significant coronary artery disease.  Her reduced EF was thought to be related to tachycardia mediated cardiomyopathy.  The patient was placed on Eliquis carvedilol and Entresto.  She subsequently underwent a TEE cardioversion on 10/03/2019 and was able to maintain normal sinus rhythm.  She is here for close follow-up and medication titration.  Evelyn Phelps comes today with multiple questions and concerns about her heart disease, recent admission, and medications.  She is easily tearful when discussing it.  The patient has been medically compliant.  Cannot tell if her heart rate is out of rhythm.  She denies any shortness of breath, fluid retention, chest pressure, or dizziness.  She has been weighing daily  and has had stable weights.  She is avoiding salt.   Past Medical History:  Diagnosis Date  . Former tobacco use    Quit in 2018    Past Surgical History:  Procedure Laterality Date  . CARDIOVERSION N/A 10/03/2019   Procedure: CARDIOVERSION;  Surgeon: Donato Heinz, MD;  Location: [facility] ENDOSCOPY;  Service: Endoscopy;  Laterality: N/A;  . LEFT HEART CATH AND CORONARY ANGIOGRAPHY N/A 10/01/2019   Procedure: LEFT HEART CATH AND CORONARY ANGIOGRAPHY;  Surgeon: Wellington Hampshire, MD;  Location: [place] CV LAB;  Service: Cardiovascular;  Laterality: N/A;  . TEE WITHOUT CARDIOVERSION N/A 10/03/2019   Procedure: TRANSESOPHAGEAL ECHOCARDIOGRAM (TEE);  Surgeon: Donato Heinz, MD;  Location: [facility] ENDOSCOPY;  Service: Endoscopy;  Laterality: N/A;     Current Outpatient Medications  Medication Sig Dispense Refill  . apixaban (ELIQUIS) 5 MG TABS tablet Take 1 tablet (5 mg total) by mouth 2 (two) times daily. 180 tablet 3  . carvedilol (COREG) 12.5 MG tablet Take 1 tablet (12.5 mg total) by mouth 2 (two) times daily with a meal. 180 tablet 3  . sacubitril-valsartan (ENTRESTO) 24-26 MG Take 1 tablet by mouth 2 (two) times daily. 180 tablet 3  . nystatin (MYCOSTATIN/NYSTOP) powder Apply topically 4 (four) times daily. 15 g 0   No current facility-administered medications for this visit.     Allergies:   Erythromycin    Social History:  The patient  reports that she quit smoking about 2 years ago. She has never used smokeless tobacco. She reports previous alcohol use.  Family History:  The patient's family history includes Dementia in her father; High Cholesterol in her father; Thyroid disease in her mother.    ROS: All other systems are reviewed and negative. Unless otherwise mentioned in H&P    PHYSICAL EXAM: VS:  BP 122/74   Pulse 75   Ht 5\' 6"  (1.676 m)   Wt 192 lb 11.2 oz (87.4 kg)   BMI 31.10 kg/m  , BMI Body mass index is 31.1 kg/m. GEN: Well nourished, well  developed, in no acute distress HEENT: normal Neck: no JVD, carotid bruits, or masses Cardiac: IRRR; no murmurs, rubs, or gallops,no edema  Respiratory:  Clear to auscultation bilaterally, normal work of breathing GI: soft, nontender, nondistended, + BS MS: no deformity or atrophy Skin: warm and dry, no rash Neuro:  Strength and sensation are intact Psych: euthymic mood, full affect   EKG: Atrial fibrillation, heart rate 75 bpm, left bundle branch block.    Recent Labs: 09/28/2019: ALT 38; B Natriuretic Peptide 1,325.1; TSH 1.459 09/30/2019: Magnesium 1.8 10/01/2019: Hemoglobin 13.9; Platelets 189 10/02/2019: BUN 22; Creatinine, Ser 0.94; Potassium 4.4; Sodium 142    Lipid Panel    Component Value Date/Time   CHOL 172 09/30/2019 0527   TRIG 83 09/30/2019 0527   HDL 42 09/30/2019 0527   CHOLHDL 4.1 09/30/2019 0527   VLDL 17 09/30/2019 0527   LDLCALC 113 (H) 09/30/2019 0527      Wt Readings from Last 3 Encounters:  10/14/19 192 lb 11.2 oz (87.4 kg)  10/03/19 192 lb (87.1 kg)      Other studies Reviewed: 1. Left ventricular ejection fraction, by visual estimation, is 25 to 30%. The left ventricle has severely decreased function. Severely increased left ventricular size. There is mildly increased left ventricular hypertrophy. 2. Left ventricular diastolic function could not be evaluated pattern of LV diastolic filling. 3. Global right ventricle has normal systolic function.The right ventricular size is normal. 4. Left atrial size was normal. 5. Right atrial size was normal. 6. Mild mitral annular calcification. 7. The mitral valve is abnormal. Mild mitral valve regurgitation. No evidence of mitral stenosis. 8. The tricuspid valve is normal in structure. Tricuspid valve regurgitation is mild. 9. The aortic valve is tricuspid Aortic valve regurgitation is trivial by color flow Doppler. Mild aortic valve sclerosis without stenosis. 10. The pulmonic valve was not  well visualized. Pulmonic valve regurgitation is not visualized by color flow Doppler. 11. Mildly elevated pulmonary artery systolic pressure. 12. The inferior vena cava is dilated in size with <50% respiratory variability, suggesting right atrial pressure of 15 mmHg. 13. Akinesis of the anteroseptal, apical and inferior walls; overall severely reduced LV systolic function; severe LVE; mild LVH; trace AI; mild MR and TR.  LHC 10/01/2019   Prox RCA lesion is 20% stenosed.  There is severe left ventricular systolic dysfunction.  LV end diastolic pressure is normal.  The left ventricular ejection fraction is 25-35% by visual estimate.  1. Mild stenosis in a small nondominant right coronary artery. Otherwise normal coronary arteries. 2. Severely reduced LV systolic function. 3. Normal left ventricular end-diastolic pressure.  Recommendations: The patient has nonischemic cardiomyopathy likely tachycardia induced. Recommend medical therapy for cardiomyopathy. Consider TEE guided cardioversion tomorrow. I am going to start the patient on Eliquis 5 mg twice daily to be started this evening.  ASSESSMENT AND PLAN:  1.  Tachycardia mediated cardiomyopathy: The patient remains on Entresto 24 mg / 26 mg daily with blood pressure 120/60 but lower at home.  She denies any fluid retention, she is avoiding salt, and tries to remain active.  I have not increased dose of Entresto as she has kept her blood pressure low at home and it is only slightly elevated here today as she is anxious.  I have discussed with her the need to keep her blood pressure 110/50 or less as long as she can tolerate it without dizziness or near syncope.  She is going to keep up with her blood pressure at home each day and write it down.    Would like to titrate up Entresto to 40 to 51 mg on follow-up if possible based upon blood pressure responses at home on initial dose of Entresto.  We will continue carvedilol 12.5 mg  twice daily as her heart rate is well controlled.  She is not on any diuretic therapy at this time.  I have advised her if her fluid begins to accumulate or her weight begins to go up she is to call us at which time I would like to place her on spironolactone and possibly add Lasix as needed.  Most recent labs dated 10/02/2019 revealed a creatinine of 0.94 with a potassium of 4.4 sodium of 142.  2.  Atrial fibrillation: She has returned to atrial fibrillation since hospitalization heart rate in the 70s.  Would like to have her heart rate in the 60s optimally but she is quite anxious today discussing her recent hospitalization and diagnoses.  May need to increase to 25 mg twice daily on next office visit if heart rate remains greater than 70 persistently at home.  She is asked to take her blood pressure and heart rate and recorded each day and bring back to the office.  She will continue on apixaban 5 mg twice daily.  I have answered multiple questions and discussed her heart function, atrial fibrillation, each medication and what it is for, I have discussed heart healthy diet, low-sodium diet, daily weights, and need to continue to monitor her blood pressure and heart rate at home.  She is back in atrial fibrillation at this time which is upsetting to her.  She is to follow-up with Dr. Cristal Deer in 1 month.  May need to consider repeat cardioversion at that time.  Will discuss further with Dr. Cristal Deer. She will need follow-up echocardiogram in 3 months for reevaluation of LV function.  I have given her information on a website: [employer]org which will provide more information concerning heart disease, atrial fibrillation, and cardiomyopathy.  Current medicines are reviewed at length with the patient today.    Labs/ tests ordered today include: None  Bettey Mare. Liborio Nixon, ANP, AACC   10/14/2019 11:24 AM    [facility] [address] Office [phone]  Fax [phone]  Notice: This dictation was prepared with Dragon dictation along with smaller phrase technology. Any transcriptional errors that result from this process are unintentional and may not be corrected upon review.

## 2019-10-14 ENCOUNTER — Ambulatory Visit (INDEPENDENT_AMBULATORY_CARE_PROVIDER_SITE_OTHER): Payer: Medicare Other | Admitting: Adult Health

## 2019-10-14 ENCOUNTER — Encounter: Payer: Self-pay | Admitting: Adult Health

## 2019-10-14 ENCOUNTER — Other Ambulatory Visit: Payer: Self-pay

## 2019-10-14 VITALS — BP 122/74 | HR 75 | Ht 66.0 in | Wt 192.7 lb

## 2019-10-14 DIAGNOSIS — I4891 Unspecified atrial fibrillation: Secondary | ICD-10-CM

## 2019-10-14 DIAGNOSIS — R000 Tachycardia, unspecified: Secondary | ICD-10-CM | POA: Diagnosis not present

## 2019-10-14 DIAGNOSIS — I5043 Acute on chronic combined systolic (congestive) and diastolic (congestive) heart failure: Secondary | ICD-10-CM

## 2019-10-14 DIAGNOSIS — I43 Cardiomyopathy in diseases classified elsewhere: Secondary | ICD-10-CM | POA: Diagnosis not present

## 2019-10-14 MED ORDER — NYSTATIN 100000 UNIT/GM EX POWD: Freq: Four times a day (QID) | CUTANEOUS | 0 refills | Status: DC

## 2019-10-14 MED ORDER — CARVEDILOL 12.5 MG PO TABS: 12.5000 mg | ORAL_TABLET | Freq: Two times a day (BID) | ORAL | 3 refills | Status: DC

## 2019-10-14 MED ORDER — APIXABAN 5 MG PO TABS: 5.0000 mg | ORAL_TABLET | Freq: Two times a day (BID) | ORAL | 3 refills | Status: DC

## 2019-10-14 MED ORDER — SACUBITRIL-VALSARTAN 24-26 MG PO TABS: 1.0000 | ORAL_TABLET | Freq: Two times a day (BID) | ORAL | 3 refills | Status: DC

## 2019-10-14 NOTE — Patient Instructions (Signed)
Medication Instructions:  Continue current medications  *If you need a refill on your cardiac medications before your next appointment, please call your pharmacy*  Lab Work: None Ordered  Testing/Procedures: None Ordered  Follow-Up: At [facility], you and your health needs are our priority.  As part of our continuing mission to provide you with exceptional heart care, we have created designated Provider Care Teams.  These Care Teams include your primary Cardiologist (physician) and Advanced Practice Providers (APPs -  Physician Assistants and Nurse Practitioners) who all work together to provide you with the care you need, when you need it.  Your next appointment:   1 Month  The format for your next appointment:   In Person  Provider:   Buford Dresser, MD

## 2019-11-12 ENCOUNTER — Other Ambulatory Visit: Payer: Self-pay

## 2019-11-12 ENCOUNTER — Encounter: Payer: Self-pay | Admitting: Cardiology

## 2019-11-12 ENCOUNTER — Ambulatory Visit: Payer: Medicare Other | Admitting: Cardiology

## 2019-11-12 VITALS — BP 123/74 | HR 74 | Ht 66.0 in | Wt 193.0 lb

## 2019-11-12 DIAGNOSIS — Z79899 Other long term (current) drug therapy: Secondary | ICD-10-CM

## 2019-11-12 DIAGNOSIS — I4819 Other persistent atrial fibrillation: Secondary | ICD-10-CM | POA: Diagnosis not present

## 2019-11-12 DIAGNOSIS — Z7189 Other specified counseling: Secondary | ICD-10-CM

## 2019-11-12 DIAGNOSIS — I10 Essential (primary) hypertension: Secondary | ICD-10-CM

## 2019-11-12 DIAGNOSIS — I110 Hypertensive heart disease with heart failure: Secondary | ICD-10-CM | POA: Diagnosis not present

## 2019-11-12 DIAGNOSIS — I5042 Chronic combined systolic (congestive) and diastolic (congestive) heart failure: Secondary | ICD-10-CM

## 2019-11-12 NOTE — Progress Notes (Signed)
Cardiology Office Note:    Date:  11/12/2019   ID:  Evelyn Phelps, DOB [birth year], MRN [id number]  PCP:  Patient, No Pcp Per  Cardiologist:  Buford Dresser, MD  Referring MD: No ref. provider found   CC: follow up  History of Present Illness:    Evelyn Phelps is a 68 y.o. female with a hx of chronic systolic and diastolic heart failure, paroxysmal atrial fibrillation who is seen in follow up. I met her during her hospitalization 09/2019.  Cardiac history: new diagnosis of acute combined systolic and diastolic heart failure, afib RVR during admission 09/2019.  She had TEE-CV during that admission. On TOC follow up was back in atrial fibrillation.  Today: Still has occasional shortness of breath, though much improved from prior. Notes SOB if she goes from house to yard and back. She is not sure how long she was in sinus rhythm post discharge, cannot tell if she feels worse in afib. She is still in afib today, HR in the 70s. We discussed cardioversion at length today.   Overall today we spent significant time reviewing her workup, discussing afib and data re: heart failure. Discussed how we monitor function. Reviewed HF education, what to watch for. BP logs reviewed as well. Most systolic Bps [id number], only rare 110s. Discussed data for entresto, optimizing dose. She is amenable to this today.  Does not have PCP. We discussed the importance of this. I offered suggestions, but she wants to find someone who is a good fit for her.  Denies chest pain, shortness of breath at rest. No PND, orthopnea, LE edema or unexpected weight gain. No syncope or palpitations.  Past Medical History:  Diagnosis Date  . Former tobacco use    Quit in 2018    Past Surgical History:  Procedure Laterality Date  . CARDIOVERSION N/A 10/03/2019   Procedure: CARDIOVERSION;  Surgeon: Donato Heinz, MD;  Location: [facility] ENDOSCOPY;  Service: Endoscopy;  Laterality: N/A;  . LEFT HEART CATH AND CORONARY  ANGIOGRAPHY N/A 10/01/2019   Procedure: LEFT HEART CATH AND CORONARY ANGIOGRAPHY;  Surgeon: Wellington Hampshire, MD;  Location: [facility] CV LAB;  Service: Cardiovascular;  Laterality: N/A;  . TEE WITHOUT CARDIOVERSION N/A 10/03/2019   Procedure: TRANSESOPHAGEAL ECHOCARDIOGRAM (TEE);  Surgeon: Donato Heinz, MD;  Location: [facility] ENDOSCOPY;  Service: Endoscopy;  Laterality: N/A;    Current Medications: Current Outpatient Medications on File Prior to Visit  Medication Sig  . apixaban (ELIQUIS) 5 MG TABS tablet Take 1 tablet (5 mg total) by mouth 2 (two) times daily.  . carvedilol (COREG) 12.5 MG tablet Take 1 tablet (12.5 mg total) by mouth 2 (two) times daily with a meal.  . sacubitril-valsartan (ENTRESTO) 24-26 MG Take 1 tablet by mouth 2 (two) times daily.   No current facility-administered medications on file prior to visit.      Allergies:   Erythromycin   Social History   Tobacco Use  . Smoking status: Former Smoker    Quit date: 09/28/2017    Years since quitting: 2.1  . Smokeless tobacco: Never Used  Substance Use Topics  . Alcohol use: Not Currently  . Drug use: Not on file    Family History: family history includes Dementia in her father; High Cholesterol in her father; Thyroid disease in her mother.  ROS:   Please see the history of present illness.  Additional pertinent ROS: Constitutional: Negative for chills, fever, night sweats, unintentional weight loss  HENT: Negative for ear  pain and hearing loss.   Eyes: Negative for loss of vision and eye pain.  Respiratory: Negative for cough, sputum, wheezing.   Cardiovascular: See HPI. Gastrointestinal: Negative for abdominal pain, melena, and hematochezia.  Genitourinary: Negative for dysuria and hematuria.  Musculoskeletal: Negative for falls and myalgias.  Skin: Negative for itching and rash.  Neurological: Negative for focal weakness, focal sensory changes and loss of consciousness.  Endo/Heme/Allergies:  Does not bruise/bleed easily.   EKGs/Labs/Other Studies Reviewed:    The following studies were reviewed today: Echo 09/29/19 1. Left ventricular ejection fraction, by visual estimation, is 25 to 30%. The left ventricle has severely decreased function. Severely increased left ventricular size. There is mildly increased left ventricular hypertrophy. 2. Left ventricular diastolic function could not be evaluated pattern of LV diastolic filling. 3. Global right ventricle has normal systolic function.The right ventricular size is normal. 4. Left atrial size was normal. 5. Right atrial size was normal. 6. Mild mitral annular calcification. 7. The mitral valve is abnormal. Mild mitral valve regurgitation. No evidence of mitral stenosis. 8. The tricuspid valve is normal in structure. Tricuspid valve regurgitation is mild. 9. The aortic valve is tricuspid Aortic valve regurgitation is trivial by color flow Doppler. Mild aortic valve sclerosis without stenosis. 10. The pulmonic valve was not well visualized. Pulmonic valve regurgitation is not visualized by color flow Doppler. 11. Mildly elevated pulmonary artery systolic pressure. 12. The inferior vena cava is dilated in size with <50% respiratory variability, suggesting right atrial pressure of 15 mmHg. 13. Akinesis of the anteroseptal, apical and inferior walls; overall severely reduced LV systolic function; severe LVE; mild LVH; trace AI; mild MR and TR.  LHC 10/01/2019   Prox RCA lesion is 20% stenosed.  There is severe left ventricular systolic dysfunction.  LV end diastolic pressure is normal.  The left ventricular ejection fraction is 25-35% by visual estimate.  1. Mild stenosis in a small nondominant right coronary artery. Otherwise normal coronary arteries. 2. Severely reduced LV systolic function. 3. Normal left ventricular end-diastolic pressure.  Recommendations: The patient has nonischemic cardiomyopathy  likely tachycardia induced. Recommend medical therapy for cardiomyopathy. Consider TEE guided cardioversion tomorrow. I am going to start the patient on Eliquis 5 mg twice daily to be started this evening.  EKG:  EKG is personally reviewed.  The ekg ordered today demonstrates atrial fibrillation with LBBB at 78 bpm.  Recent Labs: 09/28/2019: ALT 38; B Natriuretic Peptide 1,325.1; TSH 1.459 09/30/2019: Magnesium 1.8 10/01/2019: Hemoglobin 13.9; Platelets 189 10/02/2019: BUN 22; Creatinine, Ser 0.94; Potassium 4.4; Sodium 142  Recent Lipid Panel    Component Value Date/Time   CHOL 172 09/30/2019 0527   TRIG 83 09/30/2019 0527   HDL 42 09/30/2019 0527   CHOLHDL 4.1 09/30/2019 0527   VLDL 17 09/30/2019 0527   LDLCALC 113 (H) 09/30/2019 0527    Physical Exam:    VS:  BP 123/74   Pulse 74   Ht 5' 6"  (1.676 m)   Wt 193 lb (87.5 kg)   SpO2 97%   BMI 31.15 kg/m     Wt Readings from Last 3 Encounters:  11/12/19 193 lb (87.5 kg)  10/14/19 192 lb 11.2 oz (87.4 kg)  10/03/19 192 lb (87.1 kg)    GEN: Well nourished, well developed in no acute distress HEENT: Normal, moist mucous membranes NECK: No JVD CARDIAC: irregularly irregular rhythm, normal S1 and S2, no rubs or gallops. No murmur. VASCULAR: Radial and DP pulses 2+ bilaterally. No carotid bruits RESPIRATORY:  Clear to auscultation without rales, wheezing or rhonchi  ABDOMEN: Soft, non-tender, non-distended MUSCULOSKELETAL:  Ambulates independently SKIN: Warm and dry, no edema NEUROLOGIC:  Alert and oriented x 3. No focal neuro deficits noted. PSYCHIATRIC:  Normal affect   ASSESSMENT:    1. Chronic combined systolic and diastolic heart failure ([place])   2. Encounter for education about heart failure   3. Persistent atrial fibrillation ([place])   4. Essential hypertension   5. Medication management    PLAN:    Nonischemic cardiomyopathy, suspect tachycardia-induced, with chronic systolic and diastolic heart failure:  -on  entresto, blood pressure allows for titration today. Will increase to 2 pills of 24-16m entresto BID to see if she tolerates. She will check home BP. If tolerates, change this to long term dose, check BMET -NYHA class I -continue carvedilol 12.5 mg BID -heart failure education given, no fluid retention that has required diuretics  Hypertension: room for entresto titration, as above  Persistent atrial fibrillation:  -TEE/CV while admitted with restoration of sinus rhythm, but back in rate controlled afib 10/27 TOC follow up -continue apixaban 5 mg BID CHA2DS2/VAS Stroke Risk Points = 3  -we discussed repeat cardioversion. No missed doses of AC. She is unsure if she wants to pursue this. Will consider and let me know  Cardiac risk counseling and prevention recommendations: -recommend heart healthy/Mediterranean diet, with whole grains, fruits, vegetable, fish, lean meats, nuts, and olive oil. Limit salt. -recommend moderate walking, 3-5 times/week for 30-50 minutes each session. Aim for at least 150 minutes.week. Goal should be pace of 3 miles/hours, or walking 1.5 miles in 30 minutes -recommend avoidance of tobacco products. Avoid excess alcohol. -ASCVD risk score: The 10-year ASCVD risk score (Mikey BussingDC JBrooke Bonito, et al., 2013) is: 18.6%   Values used to calculate the score:     Age: 89years     Sex: Female     Is Non-Hispanic African American: No     Diabetic: No     Tobacco smoker: Yes     Systolic Blood Pressure: 1073mmHg     Is BP treated: Yes     HDL Cholesterol: 42 mg/dL     Total Cholesterol: 172 mg/dL    Plan for follow up: 11/27/19 (virtual) for BP/entresto check  TIME SPENT WITH PATIENT: 45 minutes of direct patient care. More than 50% of that time was spent on coordination of care and counseling regarding test review, guideline recommendations, medication discussion, and heart failure education.  BBuford Dresser MD, PhD [place]  [facility]   Medication  Adjustments/Labs and Tests Ordered: Current medicines are reviewed at length with the patient today.  Concerns regarding medicines are outlined above.  Orders Placed This Encounter  Procedures  . EKG 12-Lead   No orders of the defined types were placed in this encounter.   Patient Instructions  Medication Instructions:  Increase Entresto 24-26 mg to 2 tablets twice a day  -Monitor blood pressure  *If you need a refill on your cardiac medications before your next appointment, please call your pharmacy*  Lab Work: None  Testing/Procedures: None  Follow-Up: At [facility] you and your health needs are our priority.  As part of our continuing mission to provide you with exceptional heart care, we have created designated Provider Care Teams.  These Care Teams include your primary Cardiologist (physician) and Advanced Practice Providers (APPs -  Physician Assistants and Nurse Practitioners) who all work together to provide you with the care you need, when  you need it.  Your next appointment:   Virtual appointment scheduled for November 27, 2019 at 9:40 am     Signed, Buford Dresser, MD PhD 11/12/2019 6:38 PM    [place]

## 2019-11-12 NOTE — Patient Instructions (Addendum)
Medication Instructions:  Increase Entresto 24-26 mg to 2 tablets twice a day  -Monitor blood pressure  *If you need a refill on your cardiac medications before your next appointment, please call your pharmacy*  Lab Work: None  Testing/Procedures: None  Follow-Up: At [facility], you and your health needs are our priority.  As part of our continuing mission to provide you with exceptional heart care, we have created designated Provider Care Teams.  These Care Teams include your primary Cardiologist (physician) and Advanced Practice Providers (APPs -  Physician Assistants and Nurse Practitioners) who all work together to provide you with the care you need, when you need it.  Your next appointment:   Virtual appointment scheduled for November 27, 2019 at 9:40 am

## 2019-11-18 ENCOUNTER — Telehealth: Payer: Self-pay | Admitting: Cardiology

## 2019-11-18 NOTE — Telephone Encounter (Signed)
Called patient, she states that she was told to call if she had weight gain and she has gain 4 lbs. She was just seen on 11/25 and said it was mentioned maybe she could use a diuretic, patient states that she has not had any changes in diet, she watched what she ate over thanksgiving, no increase salt/sodium. She does not wear compression stockings, so I did recommend to use those in the meantime- also to elevate her feet when sitting down, patient verbalized understanding, but advised I would route a message to MD to advise if medication is needed, if so we would call it in and let her know. Patient thankful for call.

## 2019-11-18 NOTE — Telephone Encounter (Signed)
Pt c/o swelling: STAT is pt has developed SOB within 24 hours  1) How much weight have you gained and in what time span? 4 pounds in a week  2) If swelling, where is the swelling located? Feet and ankles   3) Are you currently taking a fluid pill? No   4) Are you currently SOB? Yes, but no more than usual. Has been SOB since October,   5) Do you have a log of your daily weights (if so, list)?   11/23: 189.8  11/30: 194  6) Have you gained 3 pounds in a day or 5 pounds in a week? 4 pounds in a week  7) Have you traveled recently? no

## 2019-11-19 MED ORDER — FUROSEMIDE 40 MG PO TABS: 40.0000 mg | ORAL_TABLET | Freq: Every day | ORAL | 11 refills | Status: DC | PRN

## 2019-11-19 NOTE — Telephone Encounter (Signed)
Ok to order 40 mg oral furosemide PRN for weight gain of 3 lbs overnight or 5 lbs in a week. Thanks.

## 2019-11-19 NOTE — Telephone Encounter (Signed)
Pt updated and verbalized understanding. New order placed 

## 2019-11-27 ENCOUNTER — Telehealth (INDEPENDENT_AMBULATORY_CARE_PROVIDER_SITE_OTHER): Payer: Medicare Other | Admitting: Cardiology

## 2019-11-27 VITALS — BP 112/72 | HR 75 | Ht 66.0 in | Wt 187.0 lb

## 2019-11-27 DIAGNOSIS — I43 Cardiomyopathy in diseases classified elsewhere: Secondary | ICD-10-CM

## 2019-11-27 DIAGNOSIS — R000 Tachycardia, unspecified: Secondary | ICD-10-CM | POA: Diagnosis not present

## 2019-11-27 DIAGNOSIS — I5042 Chronic combined systolic (congestive) and diastolic (congestive) heart failure: Secondary | ICD-10-CM | POA: Diagnosis not present

## 2019-11-27 MED ORDER — ENTRESTO 49-51 MG PO TABS: 1.0000 | ORAL_TABLET | Freq: Two times a day (BID) | ORAL | 11 refills | Status: DC

## 2019-11-27 NOTE — Patient Instructions (Signed)
Medication Instructions:  Your Physician recommend you continue on your current medication as directed.    *If you need a refill on your cardiac medications before your next appointment, please call your pharmacy*  Lab Work: None  Testing/Procedures: Your physician has requested that you have an echocardiogram in 6 weeks. Echocardiography is a painless test that uses sound waves to create images of your heart. It provides your doctor with information about the size and shape of your heart and how well your heart's chambers and valves are working. This procedure takes approximately one hour. There are no restrictions for this procedure. [place] [address]   Follow-Up: At [facility], you and your health needs are our priority.  As part of our continuing mission to provide you with exceptional heart care, we have created designated Provider Care Teams.  These Care Teams include your primary Cardiologist (physician) and Advanced Practice Providers (APPs -  Physician Assistants and Nurse Practitioners) who all work together to provide you with the care you need, when you need it.  Your next appointment:   6 week(s)  The format for your next appointment:   In Person  Provider:   Buford Dresser, MD

## 2019-11-27 NOTE — Progress Notes (Signed)
Virtual Visit via Telephone Note   This visit type was conducted due to national recommendations for restrictions regarding the COVID-19 Pandemic (e.g. social distancing) in an effort to limit this patient's exposure and mitigate transmission in our community.  Due to her co-morbid illnesses, this patient is at least at moderate risk for complications without adequate follow up.  This format is felt to be most appropriate for this patient at this time.  The patient did not have access to video technology/had technical difficulties with video requiring transitioning to audio format only (telephone).  All issues noted in this document were discussed and addressed.  No physical exam could be performed with this format.  Please refer to the patient's chart for her  consent to telehealth for [facility]   Date:  11/27/2019   ID:  Evelyn Phelps, DOB February 07, 1951, MRN [id number]  Patient Location: Home Provider Location: Home  PCP:  Patient, No Pcp Per  Cardiologist:  Buford Dresser, MD  Electrophysiologist:  None   Evaluation Performed:  Follow-Up Visit  Chief Complaint:  Follow up  History of Present Illness:    Evelyn Phelps is a 68 y.o. female with a hx of chronic systolic and diastolic heart failure, paroxysmal atrial fibrillation who is seen in follow up. I met her during her hospitalization 09/2019.  The patient does not have symptoms concerning for COVID-19 infection (fever, chills, cough, or new shortness of breath).   Concerns today: 2 days ago, was sitting at table, was dizzy/lightheaded. Didn't check BP. Better after she laid down for a few minutes. Yesterday AM BP was 90/63 initially, improved to 120s on recheck. Did not feel bad. Started on fluid pill, weight dropped 6 lbs with 3 doses. Reviewed how to take PRN, check weights, etc. Want to avoid taking daily if not needed. Can usually tell in her ankles. Breathing improved as well, though it wasn't as bad as it was in the  hospital--became less labored.  Taking entresto 2 tabs of 24-26 BID. Blood pressure has been 132/78 at start, now more 110s/70s. Feels well at these numbers.  Heart rates have been predominantly 60-70s, rare 80s. No high rates.   We reviewed medications today, goals for treatment, monitoring. She wants to know if her EF has improved, discussed timing of recheck echo.  Denies chest pain, shortness of breath at rest or with normal exertion. No PND, orthopnea, LE edema or unexpected weight gain. No syncope or palpitations.   Past Medical History:  Diagnosis Date  . Former tobacco use    Quit in 2018   Past Surgical History:  Procedure Laterality Date  . CARDIOVERSION N/A 10/03/2019   Procedure: CARDIOVERSION;  Surgeon: Donato Heinz, MD;  Location: [facility] ENDOSCOPY;  Service: Endoscopy;  Laterality: N/A;  . LEFT HEART CATH AND CORONARY ANGIOGRAPHY N/A 10/01/2019   Procedure: LEFT HEART CATH AND CORONARY ANGIOGRAPHY;  Surgeon: Wellington Hampshire, MD;  Location: [place] CV LAB;  Service: Cardiovascular;  Laterality: N/A;  . TEE WITHOUT CARDIOVERSION N/A 10/03/2019   Procedure: TRANSESOPHAGEAL ECHOCARDIOGRAM (TEE);  Surgeon: Donato Heinz, MD;  Location: [facility] ENDOSCOPY;  Service: Endoscopy;  Laterality: N/A;     Current Meds  Medication Sig  . apixaban (ELIQUIS) 5 MG TABS tablet Take 1 tablet (5 mg total) by mouth 2 (two) times daily.  . carvedilol (COREG) 12.5 MG tablet Take 1 tablet (12.5 mg total) by mouth 2 (two) times daily with a meal.  . furosemide (LASIX) 40 MG tablet  Take 1 tablet (40 mg total) by mouth daily as needed (weight gain of 3 lbs overnight or 5 lbs in one week).  . sacubitril-valsartan (ENTRESTO) 24-26 MG Take 1 tablet by mouth 2 (two) times daily. (Patient taking differently: Take 2 tablets by mouth 2 (two) times daily. )     Allergies:   Erythromycin   Social History   Tobacco Use  . Smoking status: Former Smoker    Quit date: 09/28/2017     Years since quitting: 2.1  . Smokeless tobacco: Never Used  Substance Use Topics  . Alcohol use: Not Currently  . Drug use: Not on file     Family Hx: The patient's family history includes Dementia in her father; High Cholesterol in her father; Thyroid disease in her mother.  ROS:   Please see the history of present illness.    Constitutional: Negative for chills, fever, night sweats, unintentional weight loss  HENT: Negative for ear pain and hearing loss.   Eyes: Negative for loss of vision and eye pain.  Respiratory: Negative for cough, sputum, wheezing.   Cardiovascular: See HPI. Gastrointestinal: Negative for abdominal pain, melena, and hematochezia.  Genitourinary: Negative for dysuria and hematuria.  Musculoskeletal: Negative for falls and myalgias.  Skin: Negative for itching and rash.  Neurological: Negative for focal weakness, focal sensory changes and loss of consciousness.  Endo/Heme/Allergies: Does not bruise/bleed easily.  All other systems reviewed and are negative.   Prior CV studies:   The following studies were reviewed today: No new since last visit  Labs/Other Tests and Data Reviewed:    EKG:  An ECG dated 11/12/2019 was personally reviewed today and demonstrated:  afib, LBBB, 78 bpm  Recent Labs: 09/28/2019: ALT 38; B Natriuretic Peptide 1,325.1; TSH 1.459 09/30/2019: Magnesium 1.8 10/01/2019: Hemoglobin 13.9; Platelets 189 10/02/2019: BUN 22; Creatinine, Ser 0.94; Potassium 4.4; Sodium 142   Recent Lipid Panel Lab Results  Component Value Date/Time   CHOL 172 09/30/2019 05:27 AM   TRIG 83 09/30/2019 05:27 AM   HDL 42 09/30/2019 05:27 AM   CHOLHDL 4.1 09/30/2019 05:27 AM   LDLCALC 113 (H) 09/30/2019 05:27 AM    Wt Readings from Last 3 Encounters:  11/27/19 187 lb (84.8 kg)  11/12/19 193 lb (87.5 kg)  10/14/19 192 lb 11.2 oz (87.4 kg)     Objective:    Vital Signs:  BP 112/72   Pulse 75   Ht 5' 6"  (1.676 m)   Wt 187 lb (84.8 kg)   BMI  30.18 kg/m    Speaking comfortably on the phone, no audible wheezing In no acute distress Alert and oriented Normal affect Normal speech  ASSESSMENT & PLAN:    Nonischemic cardiomyopathy, suspect tachycardia-induced, with chronic systolic and diastolic heart failure:  -on entresto, tolerating 49-51 mg BID. No room to increase today -NYHA class I -continue carvedilol 12.5 mg BID -heart failure education given, reviewed how to monitor weight/edema/breathing to determine when she needs diuretics -will plan to recheck echo at the end of January 2021 to evaluate if GDMT is improving her EF.  Hypertension: well controlled, no room for adjustment of BP meds today  Persistent atrial fibrillation:  -TEE/CV while admitted with restoration of sinus rhythm, but back in rate controlled afib 10/27 TOC follow up -continue apixaban 5 mg BID CHA2DS2/VAS Stroke Risk Points = 3  -she is feeling well. Previously discussed repeat cardioversion. If symptoms recur/worsen would consider  COVID-19 Education: The signs and symptoms of COVID-19 were discussed with  the patient and how to seek care for testing (follow up with PCP or arrange E-visit).  The importance of social distancing was discussed today.  Time:   Today, I have spent 21 minutes with the patient with telehealth technology discussing the above problems.    Patient Instructions  Medication Instructions:  Your Physician recommend you continue on your current medication as directed.    *If you need a refill on your cardiac medications before your next appointment, please call your pharmacy*  Lab Work: None  Testing/Procedures: Your physician has requested that you have an echocardiogram in 6 weeks. Echocardiography is a painless test that uses sound waves to create images of your heart. It provides your doctor with information about the size and shape of your heart and how well your heart's chambers and valves are working. This procedure  takes approximately one hour. There are no restrictions for this procedure. [place] [address]   Follow-Up: At [facility], you and your health needs are our priority.  As part of our continuing mission to provide you with exceptional heart care, we have created designated Provider Care Teams.  These Care Teams include your primary Cardiologist (physician) and Advanced Practice Providers (APPs -  Physician Assistants and Nurse Practitioners) who all work together to provide you with the care you need, when you need it.  Your next appointment:   6 week(s)  The format for your next appointment:   In Person  Provider:   Buford Dresser, MD      Signed, Buford Dresser, MD  11/27/2019    [place]

## 2019-12-11 ENCOUNTER — Encounter: Payer: Self-pay | Admitting: Cardiology

## 2020-01-09 ENCOUNTER — Ambulatory Visit (HOSPITAL_COMMUNITY): Payer: Medicare PPO | Attending: Cardiology

## 2020-01-09 ENCOUNTER — Other Ambulatory Visit: Payer: Self-pay

## 2020-01-09 DIAGNOSIS — I5042 Chronic combined systolic (congestive) and diastolic (congestive) heart failure: Secondary | ICD-10-CM | POA: Insufficient documentation

## 2020-01-12 ENCOUNTER — Ambulatory Visit: Payer: Medicare PPO | Admitting: Cardiology

## 2020-01-12 ENCOUNTER — Other Ambulatory Visit: Payer: Self-pay

## 2020-01-12 ENCOUNTER — Encounter: Payer: Self-pay | Admitting: Cardiology

## 2020-01-12 VITALS — BP 123/69 | HR 77 | Temp 97.5°F | Ht 66.0 in | Wt 193.8 lb

## 2020-01-12 DIAGNOSIS — Z712 Person consulting for explanation of examination or test findings: Secondary | ICD-10-CM | POA: Diagnosis not present

## 2020-01-12 DIAGNOSIS — I5042 Chronic combined systolic (congestive) and diastolic (congestive) heart failure: Secondary | ICD-10-CM

## 2020-01-12 DIAGNOSIS — I10 Essential (primary) hypertension: Secondary | ICD-10-CM

## 2020-01-12 DIAGNOSIS — I4819 Other persistent atrial fibrillation: Secondary | ICD-10-CM | POA: Diagnosis not present

## 2020-01-12 DIAGNOSIS — I43 Cardiomyopathy in diseases classified elsewhere: Secondary | ICD-10-CM

## 2020-01-12 DIAGNOSIS — R000 Tachycardia, unspecified: Secondary | ICD-10-CM | POA: Diagnosis not present

## 2020-01-12 DIAGNOSIS — Z01812 Encounter for preprocedural laboratory examination: Secondary | ICD-10-CM

## 2020-01-12 NOTE — Patient Instructions (Signed)
Medication Instructions:  Your Physician recommend you continue on your current medication as directed.    *If you need a refill on your cardiac medications before your next appointment, please call your pharmacy*  Lab Work: Your physician recommends that you return for lab work 1 week prior to cardioversion ( BMP, CBC)  If you have labs (blood work) drawn today and your tests are completely normal, you will receive your results only by: Marland Kitchen MyChart Message (if you have MyChart) OR . A paper copy in the mail If you have any lab test that is abnormal or we need to change your treatment, we will call you to review the results.  Testing/Procedures: Cardioversion [facility]  You will need to have the coronavirus test completed prior to your procedure. An appointment has been made at 12:05 on Friday 02/03/20. This is a Drive Up Visit at the [facility] [address] Someone will direct you to the appropriate testing line. Please tell them that you are there for procedure testing. Stay in your car and someone will be with you shortly. Please make sure to have all other labs completed before this test because you will need to stay quarantined until your procedure.   Follow-Up: At [facility], you and your health needs are our priority.  As part of our continuing mission to provide you with exceptional heart care, we have created designated Provider Care Teams.  These Care Teams include your primary Cardiologist (physician) and Advanced Practice Providers (APPs -  Physician Assistants and Nurse Practitioners) who all work together to provide you with the care you need, when you need it.  Your next appointment:   6 week(s)  The format for your next appointment:   In Person  Provider:   Jodelle Red, MD   You are scheduled for a  Cardioversion on Tuesday 02/03/20 with Dr. Cristal Deer.  Please arrive at the [facility] (Main Entrance A) at [facility]:  [address] [place], [place] [address] at 11 am.   DIET: Nothing to eat or drink after midnight except a sip of water with medications (see medication instructions below)  Medication Instructions:  Continue your anticoagulant: Elquis You will need to continue your anticoagulant after your procedure until you  are told by your  Provider that it is safe to stop   Labs: 1 week prior to procedure   You must have a responsible person to drive you home and stay in the waiting area during your procedure. Failure to do so could result in cancellation.  Bring your insurance cards.  *Special Note: Every effort is made to have your procedure done on time. Occasionally there are emergencies that occur at the hospital that may cause delays. Please be patient if a delay does occur.

## 2020-01-12 NOTE — H&P (View-Only) (Signed)
Cardiology Office Note:    Date:  01/12/2020   ID:  Evelyn Phelps, DOB Aug 22, 1951, MRN [id number]  PCP:  Patient, No Pcp Per  Cardiologist:  Buford Dresser, MD  CC: follow up  History of Present Illness:    Evelyn Phelps is a 69 y.o. female with a hx of chronic systolic and diastolic heart failure, paroxysmal atrial fibrillation who is seen in follow up. I met her during her hospitalization 09/2019.  Cardiac history: new diagnosis of acute combined systolic and diastolic heart failure, afib RVR during admission 09/2019.  She had TEE-CV during that admission. On TOC follow up was back in atrial fibrillation.  Today: We reviewed the results of her echocardiogram last week. LV chamber sized slightly improved, but overall LVEF not improved despite 3 mos of goal directed medical therapy for heart failure.   Today she feels well overall. Plans to get Covid vaccine tomorrow. Discussed the vaccine today, I agree with her receiving the vaccine.  Reviewed her BP/HR logs today  Overal BP range average 120-130s/70s-80s. HR 60s-70s. Rarely feels palpitations. Has had intermittent ankle edema, for which she takes the furosemide (needs only intermittently). Trying to walk in her driveway to get activity. Weights overall stable, up a bit around the holidays but thinks she had more salt. Tolerating apixaban, carvedilol, entresto.   Discussed options at length. She is worried now is not a good time, her 38 year old mother is ill and is not doing well. We discussed pros/cons of cardioversions, timing, etc. All questions answered.  Denies chest pain, shortness of breath at rest or with normal exertion. No PND, orthopnea, LE edema or unexpected weight gain. No syncope or palpitations.   Past Medical History:  Diagnosis Date  . Former tobacco use    Quit in 2018    Past Surgical History:  Procedure Laterality Date  . CARDIOVERSION N/A 10/03/2019   Procedure: CARDIOVERSION;  Surgeon: Donato Heinz, MD;  Location: [facility] ENDOSCOPY;  Service: Endoscopy;  Laterality: N/A;  . LEFT HEART CATH AND CORONARY ANGIOGRAPHY N/A 10/01/2019   Procedure: LEFT HEART CATH AND CORONARY ANGIOGRAPHY;  Surgeon: Wellington Hampshire, MD;  Location: [facility] CV LAB;  Service: Cardiovascular;  Laterality: N/A;  . TEE WITHOUT CARDIOVERSION N/A 10/03/2019   Procedure: TRANSESOPHAGEAL ECHOCARDIOGRAM (TEE);  Surgeon: Donato Heinz, MD;  Location[facility] ENDOSCOPY;  Service: Endoscopy;  Laterality: N/A;    Current Medications: Current Outpatient Medications on File Prior to Visit  Medication Sig  . apixaban (ELIQUIS) 5 MG TABS tablet Take 1 tablet (5 mg total) by mouth 2 (two) times daily.  . carvedilol (COREG) 12.5 MG tablet Take 1 tablet (12.5 mg total) by mouth 2 (two) times daily with a meal.  . furosemide (LASIX) 40 MG tablet Take 1 tablet (40 mg total) by mouth daily as needed (weight gain of 3 lbs overnight or 5 lbs in one week).  . sacubitril-valsartan (ENTRESTO) 49-51 MG Take 1 tablet by mouth 2 (two) times daily.   No current facility-administered medications on file prior to visit.     Allergies:   Erythromycin   Social History   Tobacco Use  . Smoking status: Former Smoker    Quit date: 09/28/2017    Years since quitting: 2.2  . Smokeless tobacco: Never Used  Substance Use Topics  . Alcohol use: Not Currently  . Drug use: Not on file    Family History: family history includes Dementia in her father; High Cholesterol in her  father; Thyroid disease in her mother.  ROS:   Please see the history of present illness.  Additional pertinent ROS negative except as noted in HPI.  EKGs/Labs/Other Studies Reviewed:    The following studies were reviewed today:  Echo 01/09/20  1. Left ventricular ejection fraction, by visual estimation, is 25 to 30%. The left ventricle has severely decreased function. There is mildly increased left ventricular hypertrophy.  2. Left ventricular  diastolic function could not be evaluated.  3. Moderately dilated left ventricular internal cavity size.  4. The left ventricle demonstrates regional wall motion abnormalities.  5. Global right ventricle has normal systolic function.The right ventricular size is normal.  6. Left atrial size was severely dilated.  7. Right atrial size was normal.  8. Mild mitral annular calcification.  9. The mitral valve is normal in structure. Mild mitral valve regurgitation. No evidence of mitral stenosis. 10. The tricuspid valve is normal in structure. Tricuspid valve regurgitation is mild. 11. The aortic valve is tricuspid. Aortic valve regurgitation is not visualized. No evidence of aortic valve sclerosis or stenosis. 12. The pulmonic valve was normal in structure. Pulmonic valve regurgitation is trivial. 13. The inferior vena cava is normal in size with greater than 50% respiratory variability, suggesting right atrial pressure of 3 mmHg. 14. Akinesis of the inferior wall; dyskinesis of the anteroseptal wall; overall severe LV dysfunction; mild LVH; moderate LVE; mild MR; severe LAE; mild TR.  Echo 09/29/19 1. Left ventricular ejection fraction, by visual estimation, is 25 to 30%. The left ventricle has severely decreased function. Severely increased left ventricular size. There is mildly increased left ventricular hypertrophy. 2. Left ventricular diastolic function could not be evaluated pattern of LV diastolic filling. 3. Global right ventricle has normal systolic function.The right ventricular size is normal. 4. Left atrial size was normal. 5. Right atrial size was normal. 6. Mild mitral annular calcification. 7. The mitral valve is abnormal. Mild mitral valve regurgitation. No evidence of mitral stenosis. 8. The tricuspid valve is normal in structure. Tricuspid valve regurgitation is mild. 9. The aortic valve is tricuspid Aortic valve regurgitation is trivial by color flow Doppler. Mild  aortic valve sclerosis without stenosis. 10. The pulmonic valve was not well visualized. Pulmonic valve regurgitation is not visualized by color flow Doppler. 11. Mildly elevated pulmonary artery systolic pressure. 12. The inferior vena cava is dilated in size with <50% respiratory variability, suggesting right atrial pressure of 15 mmHg. 13. Akinesis of the anteroseptal, apical and inferior walls; overall severely reduced LV systolic function; severe LVE; mild LVH; trace AI; mild MR and TR.  LHC 10/01/2019   Prox RCA lesion is 20% stenosed.  There is severe left ventricular systolic dysfunction.  LV end diastolic pressure is normal.  The left ventricular ejection fraction is 25-35% by visual estimate.  1. Mild stenosis in a small nondominant right coronary artery. Otherwise normal coronary arteries. 2. Severely reduced LV systolic function. 3. Normal left ventricular end-diastolic pressure.  Recommendations: The patient has nonischemic cardiomyopathy likely tachycardia induced. Recommend medical therapy for cardiomyopathy. Consider TEE guided cardioversion tomorrow. I am going to start the patient on Eliquis 5 mg twice daily to be started this evening.  EKG:  EKG is personally reviewed.  The ekg ordered today demonstrates atrial fibrillation with LBBB at 74 bpm.  Recent Labs: 09/28/2019: ALT 38; B Natriuretic Peptide 1,325.1; TSH 1.459 09/30/2019: Magnesium 1.8 10/01/2019: Hemoglobin 13.9; Platelets 189 10/02/2019: BUN 22; Creatinine, Ser 0.94; Potassium 4.4; Sodium 142  Recent Lipid Panel  Component Value Date/Time   CHOL 172 09/30/2019 0527   TRIG 83 09/30/2019 0527   HDL 42 09/30/2019 0527   CHOLHDL 4.1 09/30/2019 0527   VLDL 17 09/30/2019 0527   LDLCALC 113 (H) 09/30/2019 0527    Physical Exam:    VS:  BP 123/69   Pulse 77   Temp (!) 97.5 F (36.4 C)   Ht 5' 6" (1.676 m)   Wt 193 lb 12.8 oz (87.9 kg)   SpO2 97%   BMI 31.28 kg/m     Wt Readings  from Last 3 Encounters:  01/12/20 193 lb 12.8 oz (87.9 kg)  11/27/19 187 lb (84.8 kg)  11/12/19 193 lb (87.5 kg)    GEN: Well nourished, well developed in no acute distress HEENT: Normal, moist mucous membranes NECK: No JVD CARDIAC: irregularly irregular rhythm, normal S1 and S2, no rubs or gallops. No murmur. VASCULAR: Radial and DP pulses 2+ bilaterally. No carotid bruits RESPIRATORY:  Clear to auscultation without rales, wheezing or rhonchi  ABDOMEN: Soft, non-tender, non-distended MUSCULOSKELETAL:  Ambulates independently SKIN: Warm and dry, no edema NEUROLOGIC:  Alert and oriented x 3. No focal neuro deficits noted. PSYCHIATRIC:  Normal affect   ASSESSMENT:    1. Encounter to discuss test results   2. Chronic combined systolic and diastolic heart failure ([place])   3. Tachycardia induced cardiomyopathy ([facility])   4. Persistent atrial fibrillation ([place])   5. Essential hypertension   6. Pre-procedure lab exam    PLAN:    Nonischemic cardiomyopathy, suspect tachycardia-induced, with chronic systolic and diastolic heart failure:  -echo not significantly improved on recheck, despite GDMT -tolerating 2 pills of 24-30m entresto BID, will consolidate with next prescription to 49-51 mg BID. -NYHA class I-II -continue carvedilol 12.5 mg BID -heart failure education given, no fluid retention that has required diuretics  Hypertension: managed with HF medications, as above  Persistent atrial fibrillation:  -TEE/CV while admitted with restoration of sinus rhythm, but back in rate controlled afib 10/27 TOC follow up -continue apixaban 5 mg BID CHA2DS2/VAS Stroke Risk Points = 3  -we discussed repeat cardioversion. No missed doses of AC.  -we discussed at length today, pros/cons, timing. Ultimately agreed to trial cardioversion again as her heart function has not improved.  Cardiac risk counseling and prevention recommendations: -recommend heart healthy/Mediterranean diet, with whole  grains, fruits, vegetable, fish, lean meats, nuts, and olive oil. Limit salt. -recommend moderate walking, 3-5 times/week for 30-50 minutes each session. Aim for at least 150 minutes.week. Goal should be pace of 3 miles/hours, or walking 1.5 miles in 30 minutes -recommend avoidance of tobacco products. Avoid excess alcohol. -ASCVD risk score: The 10-year ASCVD risk score (Mikey BussingDC JBrooke Bonito, et al., 2013) is: 15.7%   Values used to calculate the score:     Age: [age]years     Sex: Female     Is Non-Hispanic African American: No     Diabetic: No     Tobacco smoker: Yes     Systolic Blood Pressure: 1702mmHg     Is BP treated: Yes     HDL Cholesterol: 42 mg/dL     Total Cholesterol: 172 mg/dL    Plan for follow up: 6 weeks, to follow up after cardioversion.  Total time of encounter: 50 minutes total time of encounter, including 40 minutes spent in face-to-face patient care. This time includes coordination of care and counseling regarding atrial fibrillation and heart failure management. Remainder of non-face-to-face time involved reviewing chart documents/testing  relevant to the patient encounter and documentation in the medical record.  Buford Dresser, MD, PhD [place]  [facility]   In room at 1:39, out at 2:19  Medication Adjustments/Labs and Tests Ordered: Current medicines are reviewed at length with the patient today.  Concerns regarding medicines are outlined above.  Orders Placed This Encounter  Procedures  . Basic metabolic panel  . CBC  . EKG 12-Lead   No orders of the defined types were placed in this encounter.   Patient Instructions  Medication Instructions:  Your Physician recommend you continue on your current medication as directed.    *If you need a refill on your cardiac medications before your next appointment, please call your pharmacy*  Lab Work: Your physician recommends that you return for lab work 1 week prior to cardioversion ( BMP, CBC)  If  you have labs (blood work) drawn today and your tests are completely normal, you will receive your results only by: Marland Kitchen MyChart Message (if you have MyChart) OR . A paper copy in the mail If you have any lab test that is abnormal or we need to change your treatment, we will call you to review the results.  Testing/Procedures: [place] will need to have the coronavirus test completed prior to your procedure. An appointment has been made at 12:05 on Friday 02/03/20. This is a Drive Up Visit at the [facility] [address]. Someone will direct you to the appropriate testing line. Please tell them that you are there for procedure testing. Stay in your car and someone will be with you shortly. Please make sure to have all other labs completed before this test because you will need to stay quarantined until your procedure.   Follow-Up: At [facility], you and your health needs are our priority.  As part of our continuing mission to provide you with exceptional heart care, we have created designated Provider Care Teams.  These Care Teams include your primary Cardiologist (physician) and Advanced Practice Providers (APPs -  Physician Assistants and Nurse Practitioners) who all work together to provide you with the care you need, when you need it.  Your next appointment:   6 week(s)  The format for your next appointment:   In Person  Provider:   Buford Dresser, MD   You are scheduled for a  Cardioversion on Tuesday 02/03/20 with Dr. Harrell Gave.  Please arrive at the [facility] (Main Entrance A) at [facility]: [address] [place], [place] [address] at 11 am.   DIET: Nothing to eat or drink after midnight except a sip of water with medications (see medication instructions below)  Medication Instructions:  Continue your anticoagulant: Elquis You will need to continue your anticoagulant after your procedure until you  are told by  your  Provider that it is safe to stop   Labs: 1 week prior to procedure   You must have a responsible person to drive you home and stay in the waiting area during your procedure. Failure to do so could result in cancellation.  Bring your insurance cards.  *Special Note: Every effort is made to have your procedure done on time. Occasionally there are emergencies that occur at the hospital that may cause delays. Please be patient if a delay does occur.      Signed, Buford Dresser, MD PhD 01/12/2020    [place]

## 2020-01-12 NOTE — Progress Notes (Signed)
Cardiology Office Note:    Date:  01/12/2020   ID:  Evelyn Phelps, DOB 08/20/1951, MRN [id number]  PCP:  Patient, No Pcp Per  Cardiologist:  Derk Doubek, MD  CC: follow up  History of Present Illness:    Evelyn Phelps is a 69 y.o. female with a hx of chronic systolic and diastolic heart failure, paroxysmal atrial fibrillation who is seen in follow up. I met her during her hospitalization 09/2019.  Cardiac history: new diagnosis of acute combined systolic and diastolic heart failure, afib RVR during admission 09/2019.  She had TEE-CV during that admission. On TOC follow up was back in atrial fibrillation.  Today: We reviewed the results of her echocardiogram last week. LV chamber sized slightly improved, but overall LVEF not improved despite 3 mos of goal directed medical therapy for heart failure.   Today she feels well overall. Plans to get Covid vaccine tomorrow. Discussed the vaccine today, I agree with her receiving the vaccine.  Reviewed her BP/HR logs today  Overal BP range average 120-130s/70s-80s. HR 60s-70s. Rarely feels palpitations. Has had intermittent ankle edema, for which she takes the furosemide (needs only intermittently). Trying to walk in her driveway to get activity. Weights overall stable, up a bit around the holidays but thinks she had more salt. Tolerating apixaban, carvedilol, entresto.   Discussed options at length. She is worried now is not a good time, her 93 year old mother is ill and is not doing well. We discussed pros/cons of cardioversions, timing, etc. All questions answered.  Denies chest pain, shortness of breath at rest or with normal exertion. No PND, orthopnea, LE edema or unexpected weight gain. No syncope or palpitations.   Past Medical History:  Diagnosis Date  . Former tobacco use    Quit in 2018    Past Surgical History:  Procedure Laterality Date  . CARDIOVERSION N/A 10/03/2019   Procedure: CARDIOVERSION;  Surgeon: Schumann,  Yulian Gosney L, MD;  Location: [facility];  Service: Endoscopy;  Laterality: N/A;  . LEFT HEART CATH AND CORONARY ANGIOGRAPHY N/A 10/01/2019   Procedure: LEFT HEART CATH AND CORONARY ANGIOGRAPHY;  Surgeon: Arida, Muhammad A, MD;  Location: [facility] INVASIVE CV [facility];  Service: Cardiovascular;  Laterality: N/A;  . TEE WITHOUT CARDIOVERSION N/A 10/03/2019   Procedure: TRANSESOPHAGEAL ECHOCARDIOGRAM (TEE);  Surgeon: Schumann, Casara Perrier L, MD;  Location: [facility] ENDOS[facility];  Service: Endoscopy;  Laterality: N/A;    Current Medications: Current Outpatient Medications on File Prior to Visit  Medication Sig  . apixaban (ELIQUIS) 5 MG TABS tablet Take 1 tablet (5 mg total) by mouth 2 (two) times daily.  . carvedilol (COREG) 12.5 MG tablet Take 1 tablet (12.5 mg total) by mouth 2 (two) times daily with a meal.  . furosemide (LASIX) 40 MG tablet Take 1 tablet (40 mg total) by mouth daily as needed (weight gain of 3 lbs overnight or 5 lbs in one week).  . sacubitril-valsartan (ENTRESTO) 49-51 MG Take 1 tablet by mouth 2 (two) times daily.   No current facility-administered medications on file prior to visit.     Allergies:   Erythromycin   Social History   Tobacco Use  . Smoking status: Former Smoker    Quit date: 09/28/2017    Years since quitting: 2.2  . Smokeless tobacco: Never Used  Substance Use Topics  . Alcohol use: Not Currently  . Drug use: Not on file    Family History: family history includes Dementia in her father; High Cholesterol in her   father; Thyroid disease in her mother.  ROS:   Please see the history of present illness.  Additional pertinent ROS negative except as noted in HPI.  EKGs/Labs/Other Studies Reviewed:    The following studies were reviewed today:  Echo 01/09/20  1. Left ventricular ejection fraction, by visual estimation, is 25 to 30%. The left ventricle has severely decreased function. There is mildly increased left ventricular hypertrophy.  2. Left ventricular  diastolic function could not be evaluated.  3. Moderately dilated left ventricular internal cavity size.  4. The left ventricle demonstrates regional wall motion abnormalities.  5. Global right ventricle has normal systolic function.The right ventricular size is normal.  6. Left atrial size was severely dilated.  7. Right atrial size was normal.  8. Mild mitral annular calcification.  9. The mitral valve is normal in structure. Mild mitral valve regurgitation. No evidence of mitral stenosis. 10. The tricuspid valve is normal in structure. Tricuspid valve regurgitation is mild. 11. The aortic valve is tricuspid. Aortic valve regurgitation is not visualized. No evidence of aortic valve sclerosis or stenosis. 12. The pulmonic valve was normal in structure. Pulmonic valve regurgitation is trivial. 13. The inferior vena cava is normal in size with greater than 50% respiratory variability, suggesting right atrial pressure of 3 mmHg. 14. Akinesis of the inferior wall; dyskinesis of the anteroseptal wall; overall severe LV dysfunction; mild LVH; moderate LVE; mild MR; severe LAE; mild TR.  Echo 09/29/19 1. Left ventricular ejection fraction, by visual estimation, is 25 to 30%. The left ventricle has severely decreased function. Severely increased left ventricular size. There is mildly increased left ventricular hypertrophy. 2. Left ventricular diastolic function could not be evaluated pattern of LV diastolic filling. 3. Global right ventricle has normal systolic function.The right ventricular size is normal. 4. Left atrial size was normal. 5. Right atrial size was normal. 6. Mild mitral annular calcification. 7. The mitral valve is abnormal. Mild mitral valve regurgitation. No evidence of mitral stenosis. 8. The tricuspid valve is normal in structure. Tricuspid valve regurgitation is mild. 9. The aortic valve is tricuspid Aortic valve regurgitation is trivial by color flow Doppler. Mild  aortic valve sclerosis without stenosis. 10. The pulmonic valve was not well visualized. Pulmonic valve regurgitation is not visualized by color flow Doppler. 11. Mildly elevated pulmonary artery systolic pressure. 12. The inferior vena cava is dilated in size with <50% respiratory variability, suggesting right atrial pressure of 15 mmHg. 13. Akinesis of the anteroseptal, apical and inferior walls; overall severely reduced LV systolic function; severe LVE; mild LVH; trace AI; mild MR and TR.  LHC 10/01/2019   Prox RCA lesion is 20% stenosed.  There is severe left ventricular systolic dysfunction.  LV end diastolic pressure is normal.  The left ventricular ejection fraction is 25-35% by visual estimate.  1. Mild stenosis in a small nondominant right coronary artery. Otherwise normal coronary arteries. 2. Severely reduced LV systolic function. 3. Normal left ventricular end-diastolic pressure.  Recommendations: The patient has nonischemic cardiomyopathy likely tachycardia induced. Recommend medical therapy for cardiomyopathy. Consider TEE guided cardioversion tomorrow. I am going to start the patient on Eliquis 5 mg twice daily to be started this evening.  EKG:  EKG is personally reviewed.  The ekg ordered today demonstrates atrial fibrillation with LBBB at 74 bpm.  Recent Labs: 09/28/2019: ALT 38; B Natriuretic Peptide 1,325.1; TSH 1.459 09/30/2019: Magnesium 1.8 10/01/2019: Hemoglobin 13.9; Platelets 189 10/02/2019: BUN 22; Creatinine, Ser 0.94; Potassium 4.4; Sodium 142  Recent Lipid Panel      Component Value Date/Time   CHOL 172 09/30/2019 0527   TRIG 83 09/30/2019 0527   HDL 42 09/30/2019 0527   CHOLHDL 4.1 09/30/2019 0527   VLDL 17 09/30/2019 0527   LDLCALC 113 (H) 09/30/2019 0527    Physical Exam:    VS:  BP 123/69   Pulse 77   Temp (!) 97.5 F (36.4 C)   Ht 5' 6" (1.676 m)   Wt 193 lb 12.8 oz (87.9 kg)   SpO2 97%   BMI 31.28 kg/m     Wt Readings  from Last 3 Encounters:  01/12/20 193 lb 12.8 oz (87.9 kg)  11/27/19 187 lb (84.8 kg)  11/12/19 193 lb (87.5 kg)    GEN: Well nourished, well developed in no acute distress HEENT: Normal, moist mucous membranes NECK: No JVD CARDIAC: irregularly irregular rhythm, normal S1 and S2, no rubs or gallops. No murmur. VASCULAR: Radial and DP pulses 2+ bilaterally. No carotid bruits RESPIRATORY:  Clear to auscultation without rales, wheezing or rhonchi  ABDOMEN: Soft, non-tender, non-distended MUSCULOSKELETAL:  Ambulates independently SKIN: Warm and dry, no edema NEUROLOGIC:  Alert and oriented x 3. No focal neuro deficits noted. PSYCHIATRIC:  Normal affect   ASSESSMENT:    1. Encounter to discuss test results   2. Chronic combined systolic and diastolic heart failure (HCC)   3. Tachycardia induced cardiomyopathy (HCC)   4. Persistent atrial fibrillation (HCC)   5. Essential hypertension   6. Pre-procedure lab exam    PLAN:    Nonischemic cardiomyopathy, suspect tachycardia-induced, with chronic systolic and diastolic heart failure:  -echo not significantly improved on recheck, despite GDMT -tolerating 2 pills of 24-26mg entresto BID, will consolidate with next prescription to 49-51 mg BID. -NYHA class I-II -continue carvedilol 12.5 mg BID -heart failure education given, no fluid retention that has required diuretics  Hypertension: managed with HF medications, as above  Persistent atrial fibrillation:  -TEE/CV while admitted with restoration of sinus rhythm, but back in rate controlled afib 10/27 TOC follow up -continue apixaban 5 mg BID CHA2DS2/VAS Stroke Risk Points = 3  -we discussed repeat cardioversion. No missed doses of AC.  -we discussed at length today, pros/cons, timing. Ultimately agreed to trial cardioversion again as her heart function has not improved.  Cardiac risk counseling and prevention recommendations: -recommend heart healthy/Mediterranean diet, with whole  grains, fruits, vegetable, fish, lean meats, nuts, and olive oil. Limit salt. -recommend moderate walking, 3-5 times/week for 30-50 minutes each session. Aim for at least 150 minutes.week. Goal should be pace of 3 miles/hours, or walking 1.5 miles in 30 minutes -recommend avoidance of tobacco products. Avoid excess alcohol. -ASCVD risk score: The 10-year ASCVD risk score (Goff DC Jr., et al., 2013) is: 15.7%   Values used to calculate the score:     Age: 68 years     Sex: Female     Is Non-Hispanic African American: No     Diabetic: No     Tobacco smoker: Yes     Systolic Blood Pressure: 112 mmHg     Is BP treated: Yes     HDL Cholesterol: 42 mg/dL     Total Cholesterol: 172 mg/dL    Plan for follow up: 6 weeks, to follow up after cardioversion.  Total time of encounter: 50 minutes total time of encounter, including 40 minutes spent in face-to-face patient care. This time includes coordination of care and counseling regarding atrial fibrillation and heart failure management. Remainder of non-face-to-face time involved reviewing chart documents/testing   relevant to the patient encounter and documentation in the medical record.  Buford Dresser, MD, PhD [place]  [facility]   In room at 1:39, out at 2:19  Medication Adjustments/Labs and Tests Ordered: Current medicines are reviewed at length with the patient today.  Concerns regarding medicines are outlined above.  Orders Placed This Encounter  Procedures  . Basic metabolic panel  . CBC  . EKG 12-Lead   No orders of the defined types were placed in this encounter.   Patient Instructions  Medication Instructions:  Your Physician recommend you continue on your current medication as directed.    *If you need a refill on your cardiac medications before your next appointment, please call your pharmacy*  Lab Work: Your physician recommends that you return for lab work 1 week prior to cardioversion ( BMP, CBC)  If  you have labs (blood work) drawn today and your tests are completely normal, you will receive your results only by: Marland Kitchen MyChart Message (if you have MyChart) OR . A paper copy in the mail If you have any lab test that is abnormal or we need to change your treatment, we will call you to review the results.  Testing/Procedures: [place] will need to have the coronavirus test completed prior to your procedure. An appointment has been made at 12:05 on Friday 02/03/20. This is a Drive Up Visit at the [facility] [address] Someone will direct you to the appropriate testing line. Please tell them that you are there for procedure testing. Stay in your car and someone will be with you shortly. Please make sure to have all other labs completed before this test because you will need to stay quarantined until your procedure.   Follow-Up: At [facility], you and your health needs are our priority.  As part of our continuing mission to provide you with exceptional heart care, we have created designated Provider Care Teams.  These Care Teams include your primary Cardiologist (physician) and Advanced Practice Providers (APPs -  Physician Assistants and Nurse Practitioners) who all work together to provide you with the care you need, when you need it.  Your next appointment:   6 week(s)  The format for your next appointment:   In Person  Provider:   Buford Dresser, MD   You are scheduled for a  Cardioversion on Tuesday 02/03/20 with Dr. Harrell Gave.  Please arrive at the [facility] (Main Entrance A) at [facility]: [address] [place], [place] [address] at 11 am.   DIET: Nothing to eat or drink after midnight except a sip of water with medications (see medication instructions below)  Medication Instructions:  Continue your anticoagulant: Elquis You will need to continue your anticoagulant after your procedure until you  are told by  your  Provider that it is safe to stop   Labs: 1 week prior to procedure   You must have a responsible person to drive you home and stay in the waiting area during your procedure. Failure to do so could result in cancellation.  Bring your insurance cards.  *Special Note: Every effort is made to have your procedure done on time. Occasionally there are emergencies that occur at the hospital that may cause delays. Please be patient if a delay does occur.      Signed, Buford Dresser, MD PhD 01/12/2020    [place]

## 2020-01-21 ENCOUNTER — Encounter: Payer: Self-pay | Admitting: Cardiology

## 2020-01-23 ENCOUNTER — Telehealth: Payer: Self-pay | Admitting: Cardiology

## 2020-01-23 NOTE — Telephone Encounter (Signed)
Pt updated and verbalized understanding.  

## 2020-01-23 NOTE — Telephone Encounter (Signed)
Ok for covid vaccine on same day. Thanks.

## 2020-01-23 NOTE — Telephone Encounter (Signed)
Pt state she is scheduled to get her vaccine the day as her cardioversion. Pt inquiring if MD approves of both being done on the same day.

## 2020-01-23 NOTE — Telephone Encounter (Signed)
New message   Patient wants to know if she can have the cardioversion and vaccine done the same day. Please advise.

## 2020-01-28 LAB — CBC
Hematocrit: 38.5 % (ref 34.0–46.6)
Hemoglobin: 13.2 g/dL (ref 11.1–15.9)
MCH: 32.7 pg (ref 26.6–33.0)
MCHC: 34.3 g/dL (ref 31.5–35.7)
MCV: 95 fL (ref 79–97)
Platelets: 183 10*3/uL (ref 150–450)
RBC: 4.04 x10E6/uL (ref 3.77–5.28)
RDW: 12.6 % (ref 11.7–15.4)
WBC: 5.2 10*3/uL (ref 3.4–10.8)

## 2020-01-28 LAB — BASIC METABOLIC PANEL
BUN/Creatinine Ratio: 28 (ref 12–28)
BUN: 25 mg/dL (ref 8–27)
CO2: 25 mmol/L (ref 20–29)
Calcium: 8.8 mg/dL (ref 8.7–10.3)
Chloride: 105 mmol/L (ref 96–106)
Creatinine, Ser: 0.88 mg/dL (ref 0.57–1.00)
GFR calc Af Amer: 77 mL/min/{1.73_m2} (ref >59)
GFR calc non Af Amer: 67 mL/min/{1.73_m2} (ref >59)
Glucose: 93 mg/dL (ref 65–99)
Potassium: 4.5 mmol/L (ref 3.5–5.2)
Sodium: 142 mmol/L (ref 134–144)

## 2020-01-30 ENCOUNTER — Other Ambulatory Visit (HOSPITAL_COMMUNITY)
Admission: RE | Admit: 2020-01-30 | Discharge: 2020-01-30 | Disposition: A | Discharge status: Home / Self Care | Payer: Medicare PPO | Source: Ambulatory Visit | Attending: Cardiology | Admitting: Cardiology

## 2020-01-30 DIAGNOSIS — Z20822 Contact with and (suspected) exposure to covid-19: Secondary | ICD-10-CM | POA: Insufficient documentation

## 2020-01-30 DIAGNOSIS — Z01812 Encounter for preprocedural laboratory examination: Secondary | ICD-10-CM | POA: Diagnosis present

## 2020-01-30 LAB — SARS CORONAVIRUS 2 (TAT 6-24 HRS): SARS Coronavirus 2: NEGATIVE

## 2020-02-03 ENCOUNTER — Ambulatory Visit (HOSPITAL_COMMUNITY): Payer: Medicare PPO | Admitting: Certified Registered Nurse Anesthetist

## 2020-02-03 ENCOUNTER — Encounter (HOSPITAL_COMMUNITY): Payer: Self-pay | Admitting: Cardiology

## 2020-02-03 ENCOUNTER — Encounter (HOSPITAL_COMMUNITY)
Admission: RE | Disposition: A | Discharge status: Home / Self Care | Payer: Self-pay | Source: Home / Self Care | Attending: Cardiology

## 2020-02-03 ENCOUNTER — Ambulatory Visit (HOSPITAL_COMMUNITY)
Admission: RE | Admit: 2020-02-03 | Discharge: 2020-02-03 | Disposition: A | Discharge status: Home / Self Care | Payer: Medicare PPO | Attending: Cardiology | Admitting: Cardiology

## 2020-02-03 DIAGNOSIS — I5042 Chronic combined systolic (congestive) and diastolic (congestive) heart failure: Secondary | ICD-10-CM | POA: Diagnosis not present

## 2020-02-03 DIAGNOSIS — R000 Tachycardia, unspecified: Secondary | ICD-10-CM | POA: Insufficient documentation

## 2020-02-03 DIAGNOSIS — I4819 Other persistent atrial fibrillation: Secondary | ICD-10-CM | POA: Diagnosis not present

## 2020-02-03 DIAGNOSIS — Z7901 Long term (current) use of anticoagulants: Secondary | ICD-10-CM | POA: Diagnosis not present

## 2020-02-03 DIAGNOSIS — I428 Other cardiomyopathies: Secondary | ICD-10-CM | POA: Insufficient documentation

## 2020-02-03 DIAGNOSIS — Z79899 Other long term (current) drug therapy: Secondary | ICD-10-CM | POA: Insufficient documentation

## 2020-02-03 DIAGNOSIS — I110 Hypertensive heart disease with heart failure: Secondary | ICD-10-CM | POA: Diagnosis not present

## 2020-02-03 DIAGNOSIS — Z87891 Personal history of nicotine dependence: Secondary | ICD-10-CM | POA: Diagnosis not present

## 2020-02-03 HISTORY — PX: CARDIOVERSION: SHX1299

## 2020-02-03 SURGERY — CARDIOVERSION
Anesthesia: General

## 2020-02-03 MED ORDER — PROPOFOL 10 MG/ML IV BOLUS
INTRAVENOUS | Status: DC | PRN
  Administered 2020-02-03: 60 mg via INTRAVENOUS

## 2020-02-03 MED ORDER — LIDOCAINE 2% (20 MG/ML) 5 ML SYRINGE
INTRAMUSCULAR | Status: DC | PRN
  Administered 2020-02-03: 60 mg via INTRAVENOUS

## 2020-02-03 MED ORDER — SODIUM CHLORIDE 0.9 % IV SOLN: INTRAVENOUS | Status: DC | PRN

## 2020-02-03 NOTE — Interval H&P Note (Signed)
History and Physical Interval Note:  02/03/2020 11:47 AM  Evelyn Phelps  has presented today for surgery, with the diagnosis of AFIB.  The various methods of treatment have been discussed with the patient and family. After consideration of risks, benefits and other options for treatment, the patient has consented to  Procedure(s): CARDIOVERSION (N/A) as a surgical intervention.  The patient's history has been reviewed, patient examined, no change in status, stable for surgery.  I have reviewed the patient's chart and labs.  Questions were answered to the patient's satisfaction.     Taygan Connell Cristal Deer

## 2020-02-03 NOTE — Discharge Instructions (Signed)
Electrical Cardioversion Electrical cardioversion is the delivery of a jolt of electricity to restore a normal rhythm to the heart. A rhythm that is too fast or is not regular keeps the heart from pumping well. In this procedure, sticky patches or metal paddles are placed on the chest to deliver electricity to the heart from a device. This procedure may be done in an emergency if:  There is low or no blood pressure as a result of the heart rhythm.  Normal rhythm must be restored as fast as possible to protect the brain and heart from further damage.  It may save a life. This may also be a scheduled procedure for irregular or fast heart rhythms that are not immediately life-threatening. Tell a health care provider about:  Any allergies you have.  All medicines you are taking, including vitamins, herbs, eye drops, creams, and over-the-counter medicines.  Any problems you or family members have had with anesthetic medicines.  Any blood disorders you have.  Any surgeries you have had.  Any medical conditions you have.  Whether you are pregnant or may be pregnant. What are the risks? Generally, this is a safe procedure. However, problems may occur, including:  Allergic reactions to medicines.  A blood clot that breaks free and travels to other parts of your body.  The possible return of an abnormal heart rhythm within hours or days after the procedure.  Your heart stopping (cardiac arrest). This is rare. What happens before the procedure? Medicines  Your health care provider may have you start taking: ? Blood-thinning medicines (anticoagulants) so your blood does not clot as easily. ? Medicines to help stabilize your heart rate and rhythm.  Ask your health care provider about: ? Changing or stopping your regular medicines. This is especially important if you are taking diabetes medicines or blood thinners. ? Taking medicines such as aspirin and ibuprofen. These medicines can  thin your blood. Do not take these medicines unless your health care provider tells you to take them. ? Taking over-the-counter medicines, vitamins, herbs, and supplements. General instructions  Follow instructions from your health care provider about eating or drinking restrictions.  Plan to have someone take you home from the hospital or clinic.  If you will be going home right after the procedure, plan to have someone with you for 24 hours.  Ask your health care provider what steps will be taken to help prevent infection. These may include washing your skin with a germ-killing soap. What happens during the procedure?   An IV will be inserted into one of your veins.  Sticky patches (electrodes) or metal paddles may be placed on your chest.  You will be given a medicine to help you relax (sedative).  An electrical shock will be delivered. The procedure may vary among health care providers and hospitals. What can I expect after the procedure?  Your blood pressure, heart rate, breathing rate, and blood oxygen level will be monitored until you leave the hospital or clinic.  Your heart rhythm will be watched to make sure it does not change.  You may have some redness on the skin where the shocks were given. Follow these instructions at home:  Do not drive for 24 hours if you were given a sedative during your procedure.  Take over-the-counter and prescription medicines only as told by your health care provider.  Ask your health care provider how to check your pulse. Check it often.  Rest for 48 hours after the procedure or   as told by your health care provider.  Avoid or limit your caffeine use as told by your health care provider.  Keep all follow-up visits as told by your health care provider. This is important. Contact a health care provider if:  You feel like your heart is beating too quickly or your pulse is not regular.  You have a serious muscle cramp that does not go  away. Get help right away if:  You have discomfort in your chest.  You are dizzy or you feel faint.  You have trouble breathing or you are short of breath.  Your speech is slurred.  You have trouble moving an arm or leg on one side of your body.  Your fingers or toes turn cold or blue. Summary  Electrical cardioversion is the delivery of a jolt of electricity to restore a normal rhythm to the heart.  This procedure may be done right away in an emergency or may be a scheduled procedure if the condition is not an emergency.  Generally, this is a safe procedure.  After the procedure, check your pulse often as told by your health care provider. This information is not intended to replace advice given to you by your health care provider. Make sure you discuss any questions you have with your health care provider. Document Revised: 07/07/2019 Document Reviewed: 07/07/2019 Elsevier Patient Education  2020 [employer]  

## 2020-02-03 NOTE — Anesthesia Postprocedure Evaluation (Signed)
Anesthesia Post Note  Patient: Evelyn Phelps  Procedure(s) Performed: CARDIOVERSION (N/A )     Patient location during evaluation: PACU Anesthesia Type: General Level of consciousness: awake and alert Pain management: pain level controlled Vital Signs Assessment: post-procedure vital signs reviewed and stable Respiratory status: spontaneous breathing, nonlabored ventilation, respiratory function stable and patient connected to nasal cannula oxygen Cardiovascular status: blood pressure returned to baseline and stable Postop Assessment: no apparent nausea or vomiting Anesthetic complications: no    Last Vitals:  Vitals:   02/03/20 1227 02/03/20 1228  BP: (!) 112/47 (!) 119/46  Pulse: 66 66  Resp: 16 (!) 27  Temp:  36.6 C  SpO2: 94% 95%    Last Pain:  Vitals:   02/03/20 1228  TempSrc: Oral  PainSc: 0-No pain                 Keefe Zawistowski S

## 2020-02-03 NOTE — Anesthesia Preprocedure Evaluation (Signed)
Anesthesia Evaluation  Patient identified by MRN, date of birth, ID band Patient awake    Reviewed: Allergy & Precautions, H&P , NPO status , Patient's Chart, lab work & pertinent test results  Airway Mallampati: II  TM Distance: >3 FB Neck ROM: Full    Dental no notable dental hx. (+) Partial Upper, Dental Advisory Given   Pulmonary neg pulmonary ROS, former smoker,    Pulmonary exam normal breath sounds clear to auscultation       Cardiovascular +CHF  + dysrhythmias Atrial Fibrillation  Rhythm:Irregular Rate:Normal     Neuro/Psych negative neurological ROS  negative psych ROS   GI/Hepatic negative GI ROS, Neg liver ROS,   Endo/Other  negative endocrine ROS  Renal/GU negative Renal ROS  negative genitourinary   Musculoskeletal   Abdominal   Peds  Hematology negative hematology ROS (+)   Anesthesia Other Findings   Reproductive/Obstetrics negative OB ROS                             Anesthesia Physical  Anesthesia Plan  ASA: III  Anesthesia Plan: General   Post-op Pain Management:    Induction: Intravenous  PONV Risk Score and Plan: 3 and Propofol infusion and Treatment may vary due to age or medical condition  Airway Management Planned: Mask  Additional Equipment:   Intra-op Plan:   Post-operative Plan:   Informed Consent: I have reviewed the patients History and Physical, chart, labs and discussed the procedure including the risks, benefits and alternatives for the proposed anesthesia with the patient or authorized representative who has indicated his/her understanding and acceptance.     Dental advisory given  Plan Discussed with: CRNA  Anesthesia Plan Comments:         Anesthesia Quick Evaluation

## 2020-02-03 NOTE — Transfer of Care (Signed)
Immediate Anesthesia Transfer of Care Note  Patient: Milly Jakob  Procedure(s) Performed: CARDIOVERSION (N/A )  Patient Location: Endoscopy Unit  Anesthesia Type:General  Level of Consciousness: awake, alert  and oriented  Airway & Oxygen Therapy: Patient Spontanous Breathing  Post-op Assessment: Report given to RN and Post -op Vital signs reviewed and stable  Post vital signs: Reviewed and stable  Last Vitals:  Vitals Value Taken Time  BP 112/47 02/03/20 1227  Temp    Pulse 66 02/03/20 1227  Resp 16 02/03/20 1227  SpO2 94 % 02/03/20 1227    Last Pain:  Vitals:   02/03/20 1145  TempSrc: Oral  PainSc: 0-No pain         Complications: No apparent anesthesia complications

## 2020-02-03 NOTE — CV Procedure (Signed)
Procedure:   DCCV  Indication:  Symptomatic atrial fibrillation  Procedure Note:  The patient signed informed consent.  They have had had therapeutic anticoagulation with apixaban greater than 3 weeks.  Anesthesia was administered by Dr. Okey Dupre.  She received 60 mg lidocaine and 60 mg propofol. Adequate airway was maintained throughout and vital followed per protocol.  They were cardioverted x 1 with 120J of biphasic synchronized energy.  They converted to NSR.  There were no apparent complications.  The patient had normal neuro status and respiratory status post procedure with vitals stable as recorded elsewhere.    Follow up:  They will continue on current medical therapy and follow up with cardiology as scheduled.  Jodelle Red, MD PhD 02/03/2020 12:25 PM

## 2020-02-06 ENCOUNTER — Encounter: Payer: Self-pay | Admitting: *Deleted

## 2020-02-10 IMAGING — CR DG CHEST 2V
2 series · 2 of 2 positions shown · non-contrast
Comparison: None.

CLINICAL DATA: Increasing shortness of breath over the past 2
years, worse over the past week.

EXAM:
CHEST - 2 VIEW

[w chest pa]
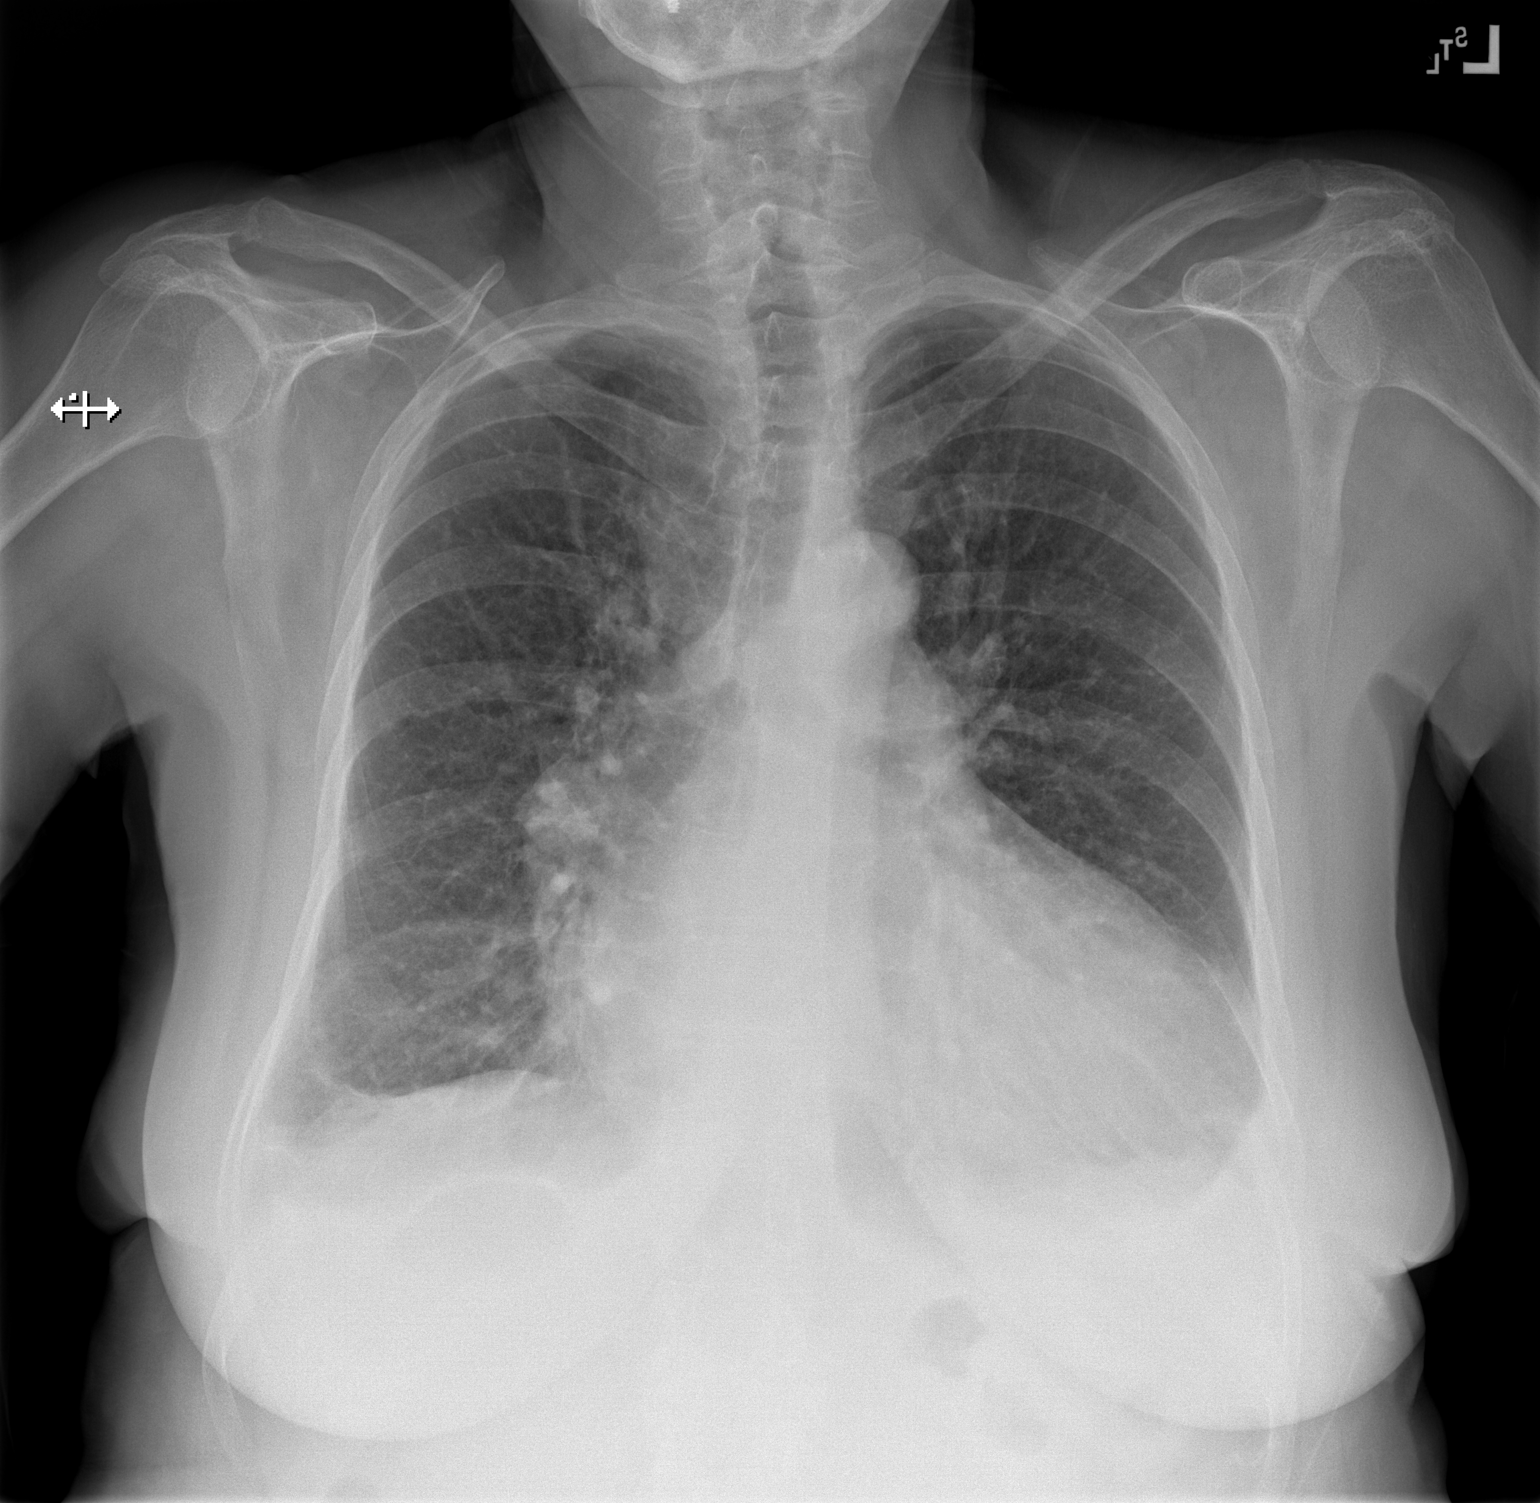

[w chest lat]
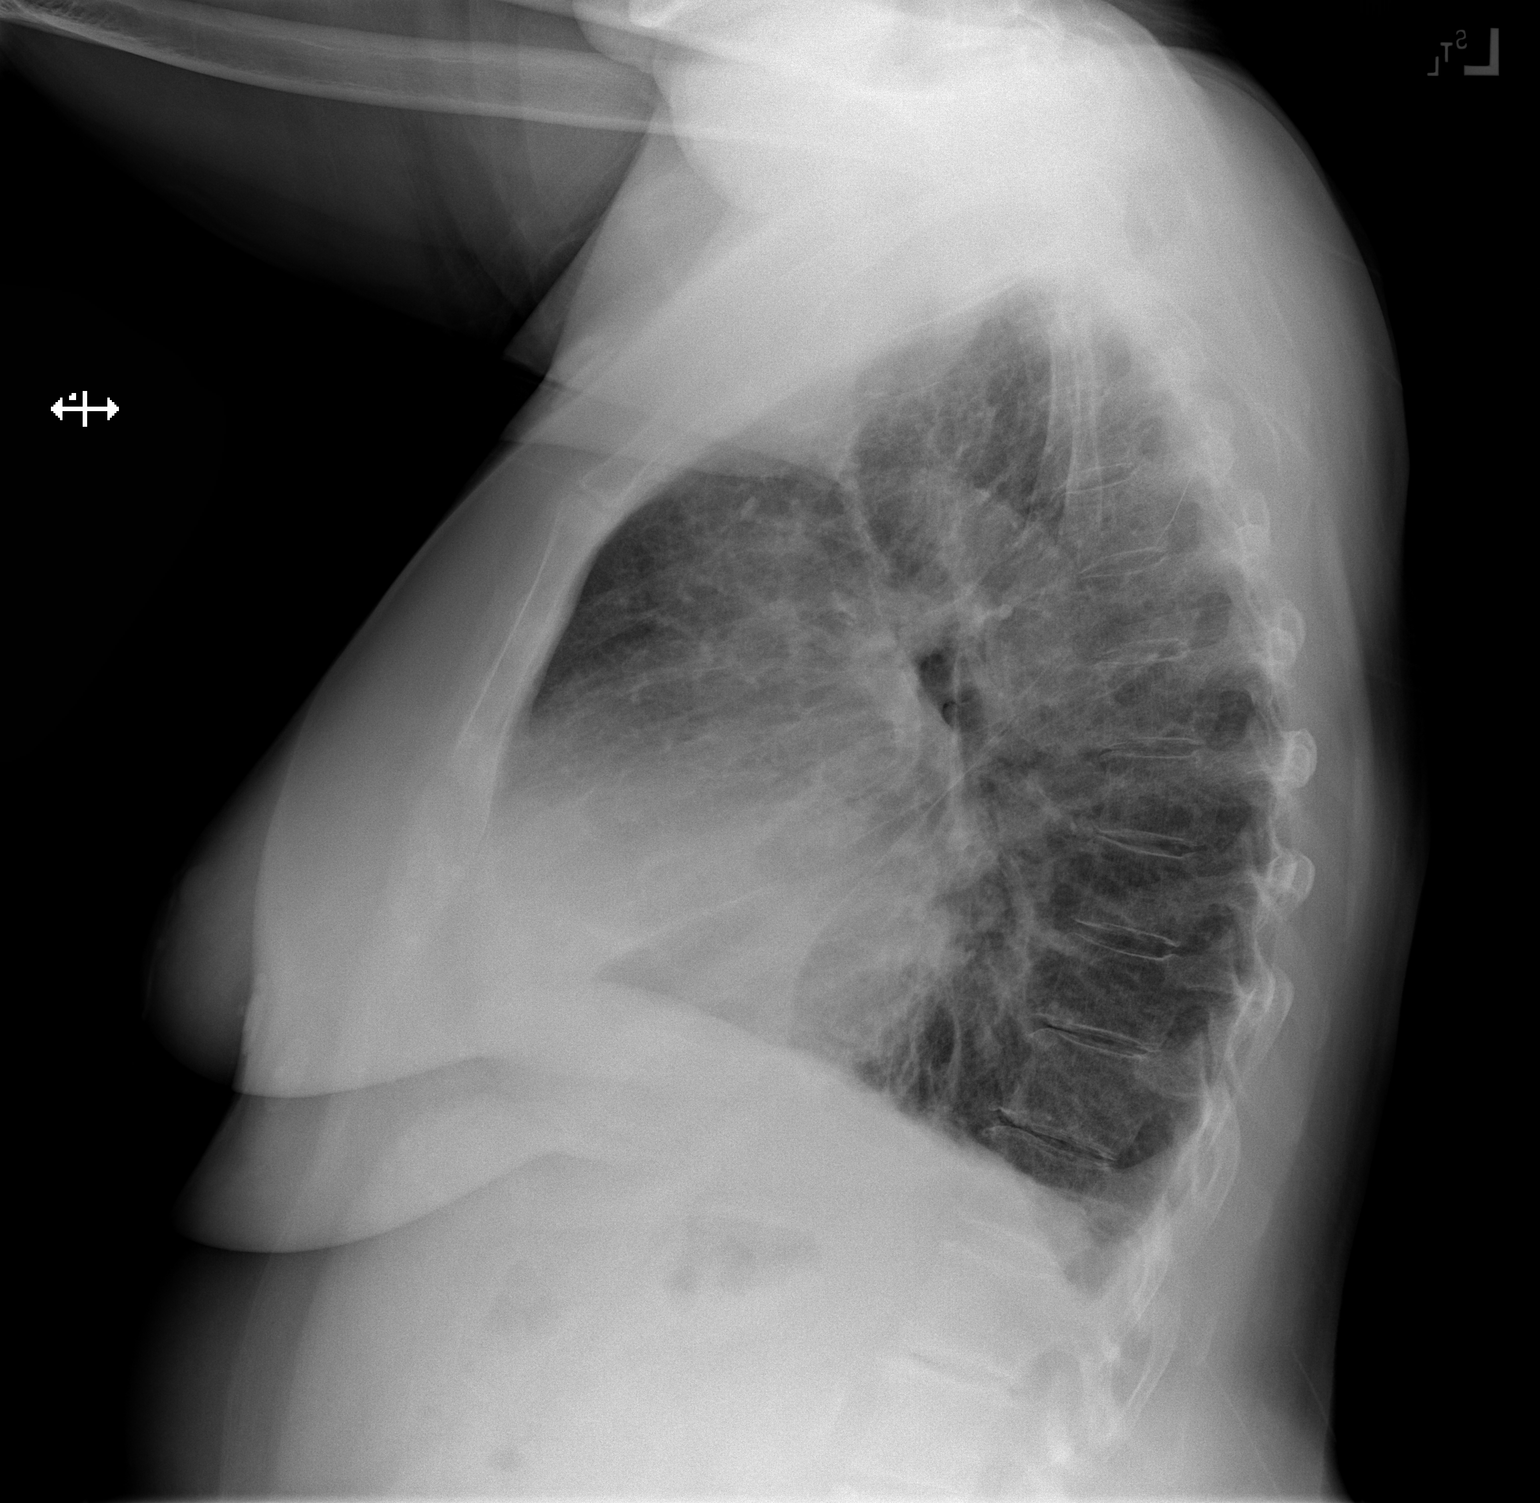

[2 of 2 positions shown; findings below may reference images not displayed]

FINDINGS: There is cardiomegaly without edema. Very small bilateral pleural
effusions and mild basilar atelectasis are seen. No pneumothorax.
IMPRESSION: Cardiomegaly without edema.

Very small bilateral pleural effusions and mild basilar atelectasis.

## 2020-02-25 ENCOUNTER — Ambulatory Visit: Payer: Medicare PPO | Admitting: Cardiology

## 2020-03-02 ENCOUNTER — Other Ambulatory Visit: Payer: Self-pay

## 2020-03-02 ENCOUNTER — Ambulatory Visit: Payer: Medicare PPO | Admitting: Cardiology

## 2020-03-02 ENCOUNTER — Encounter: Payer: Self-pay | Admitting: Cardiology

## 2020-03-02 VITALS — BP 144/76 | HR 69 | Ht 66.0 in | Wt 193.1 lb

## 2020-03-02 DIAGNOSIS — Z7189 Other specified counseling: Secondary | ICD-10-CM

## 2020-03-02 DIAGNOSIS — I10 Essential (primary) hypertension: Secondary | ICD-10-CM | POA: Diagnosis not present

## 2020-03-02 DIAGNOSIS — I429 Cardiomyopathy, unspecified: Secondary | ICD-10-CM | POA: Diagnosis not present

## 2020-03-02 DIAGNOSIS — I5042 Chronic combined systolic (congestive) and diastolic (congestive) heart failure: Secondary | ICD-10-CM

## 2020-03-02 DIAGNOSIS — I480 Paroxysmal atrial fibrillation: Secondary | ICD-10-CM

## 2020-03-02 NOTE — Progress Notes (Signed)
Cardiology Office Note:    Date:  03/02/2020   ID:  Evelyn Phelps, DOB [birth year], MRN [id number]  PCP:  Patient, No Pcp Per  Cardiologist:  Buford Dresser, MD  CC: follow up  History of Present Illness:    Evelyn Phelps is a 69 y.o. female with a hx of chronic systolic and diastolic heart failure, paroxysmal atrial fibrillation who is seen in follow up. I met her during her hospitalization 09/2019.  Cardiac history: new diagnosis of acute combined systolic and diastolic heart failure, afib RVR during admission 09/2019.  She had TEE-CV during that admission. On TOC follow up was back in atrial fibrillation.  Today: S/P cardioversion 02/03/20 and remains in sinus rhythm today. Feeling fair. Struggling with the stress of her mother's pancreatic cancer diagnosis.   BP up slightly today. Reviewed her log today, most range 114-138/71-86. Today was 146/82, only elevated day in the last several weeks.   Has been able to work in yard for up to an hour, tries to do as much as she can before she has to stop. Limited by stairs, as this is a challenge.   Has been taking lasix about 1x/week for swelling. Tolerating other medications.  Denies chest pain, shortness of breath at rest or with normal exertion. No PND, orthopnea, LE edema or unexpected weight gain. No syncope. Rare dizzy spells.   We discussed repeat echo, with possibility of CRT +/- ICD depending on results.  Past Medical History:  Diagnosis Date  . Former tobacco use    Quit in 2018    Past Surgical History:  Procedure Laterality Date  . CARDIOVERSION N/A 10/03/2019   Procedure: CARDIOVERSION;  Surgeon: Donato Heinz, MD;  Location: [facility] ENDOSCOPY;  Service: Endoscopy;  Laterality: N/A;  . CARDIOVERSION N/A 02/03/2020   Procedure: CARDIOVERSION;  Surgeon: Buford Dresser, MD;  Location: [facility] ENDOSCOPY;  Service: Cardiovascular;  Laterality: N/A;  . LEFT HEART CATH AND CORONARY ANGIOGRAPHY N/A 10/01/2019    Procedure: LEFT HEART CATH AND CORONARY ANGIOGRAPHY;  Surgeon: Wellington Hampshire, MD;  Location: [facility] CV LAB;  Service: Cardiovascular;  Laterality: N/A;  . TEE WITHOUT CARDIOVERSION N/A 10/03/2019   Procedure: TRANSESOPHAGEAL ECHOCARDIOGRAM (TEE);  Surgeon: Donato Heinz, MD;  Location: [facility] ENDOSCOPY;  Service: Endoscopy;  Laterality: N/A;    Current Medications: Current Outpatient Medications on File Prior to Visit  Medication Sig  . acetaminophen (TYLENOL) 500 MG tablet Take 500 mg by mouth every 6 (six) hours as needed (for pain.).   Marland Kitchen apixaban (ELIQUIS) 5 MG TABS tablet Take 1 tablet (5 mg total) by mouth 2 (two) times daily.  . carvedilol (COREG) 12.5 MG tablet Take 1 tablet (12.5 mg total) by mouth 2 (two) times daily with a meal.  . furosemide (LASIX) 40 MG tablet Take 1 tablet (40 mg total) by mouth daily as needed (weight gain of 3 lbs overnight or 5 lbs in one week).  . sacubitril-valsartan (ENTRESTO) 49-51 MG Take 1 tablet by mouth 2 (two) times daily.   No current facility-administered medications on file prior to visit.     Allergies:   Erythromycin   Social History   Tobacco Use  . Smoking status: Former Smoker    Quit date: 09/28/2017    Years since quitting: 2.4  . Smokeless tobacco: Never Used  Substance Use Topics  . Alcohol use: Not Currently  . Drug use: Not on file    Family History: family history includes Dementia in her father; High  Cholesterol in her father; Thyroid disease in her mother.  ROS:   Please see the history of present illness.  Additional pertinent ROS negative except as noted in HPI.  EKGs/Labs/Other Studies Reviewed:    The following studies were reviewed today:  Echo 01/09/20  1. Left ventricular ejection fraction, by visual estimation, is 25 to 30%. The left ventricle has severely decreased function. There is mildly increased left ventricular hypertrophy.  2. Left ventricular diastolic function could not be  evaluated.  3. Moderately dilated left ventricular internal cavity size.  4. The left ventricle demonstrates regional wall motion abnormalities.  5. Global right ventricle has normal systolic function.The right ventricular size is normal.  6. Left atrial size was severely dilated.  7. Right atrial size was normal.  8. Mild mitral annular calcification.  9. The mitral valve is normal in structure. Mild mitral valve regurgitation. No evidence of mitral stenosis. 10. The tricuspid valve is normal in structure. Tricuspid valve regurgitation is mild. 11. The aortic valve is tricuspid. Aortic valve regurgitation is not visualized. No evidence of aortic valve sclerosis or stenosis. 12. The pulmonic valve was normal in structure. Pulmonic valve regurgitation is trivial. 13. The inferior vena cava is normal in size with greater than 50% respiratory variability, suggesting right atrial pressure of 3 mmHg. 14. Akinesis of the inferior wall; dyskinesis of the anteroseptal wall; overall severe LV dysfunction; mild LVH; moderate LVE; mild MR; severe LAE; mild TR.  Echo 09/29/19 1. Left ventricular ejection fraction, by visual estimation, is 25 to 30%. The left ventricle has severely decreased function. Severely increased left ventricular size. There is mildly increased left ventricular hypertrophy. 2. Left ventricular diastolic function could not be evaluated pattern of LV diastolic filling. 3. Global right ventricle has normal systolic function.The right ventricular size is normal. 4. Left atrial size was normal. 5. Right atrial size was normal. 6. Mild mitral annular calcification. 7. The mitral valve is abnormal. Mild mitral valve regurgitation. No evidence of mitral stenosis. 8. The tricuspid valve is normal in structure. Tricuspid valve regurgitation is mild. 9. The aortic valve is tricuspid Aortic valve regurgitation is trivial by color flow Doppler. Mild aortic valve sclerosis without  stenosis. 10. The pulmonic valve was not well visualized. Pulmonic valve regurgitation is not visualized by color flow Doppler. 11. Mildly elevated pulmonary artery systolic pressure. 12. The inferior vena cava is dilated in size with <50% respiratory variability, suggesting right atrial pressure of 15 mmHg. 13. Akinesis of the anteroseptal, apical and inferior walls; overall severely reduced LV systolic function; severe LVE; mild LVH; trace AI; mild MR and TR.  LHC 10/01/2019   Prox RCA lesion is 20% stenosed.  There is severe left ventricular systolic dysfunction.  LV end diastolic pressure is normal.  The left ventricular ejection fraction is 25-35% by visual estimate.  1. Mild stenosis in a small nondominant right coronary artery. Otherwise normal coronary arteries. 2. Severely reduced LV systolic function. 3. Normal left ventricular end-diastolic pressure.  Recommendations: The patient has nonischemic cardiomyopathy likely tachycardia induced. Recommend medical therapy for cardiomyopathy. Consider TEE guided cardioversion tomorrow. I am going to start the patient on Eliquis 5 mg twice daily to be started this evening.  EKG:  EKG is personally reviewed.  The ekg ordered today demonstrates atrial fibrillation with LBBB at 74 bpm.  Recent Labs: 09/28/2019: ALT 38; B Natriuretic Peptide 1,325.1; TSH 1.459 09/30/2019: Magnesium 1.8 01/27/2020: BUN 25; Creatinine, Ser 0.88; Hemoglobin 13.2; Platelets 183; Potassium 4.5; Sodium 142  Recent  Lipid Panel    Component Value Date/Time   CHOL 172 09/30/2019 0527   TRIG 83 09/30/2019 0527   HDL 42 09/30/2019 0527   CHOLHDL 4.1 09/30/2019 0527   VLDL 17 09/30/2019 0527   LDLCALC 113 (H) 09/30/2019 0527    Physical Exam:    VS:  BP (!) 144/76   Pulse 69   Ht 5' 6" (1.676 m)   Wt 193 lb 1.6 oz (87.6 kg)   BMI 31.17 kg/m     Wt Readings from Last 3 Encounters:  03/02/20 193 lb 1.6 oz (87.6 kg)  02/03/20 191 lb (86.6  kg)  01/12/20 193 lb 12.8 oz (87.9 kg)    GEN: Well nourished, well developed in no acute distress HEENT: Normal, moist mucous membranes NECK: No JVD CARDIAC: regular rhythm, normal S1 and S2, no rubs or gallops. No murmur. VASCULAR: Radial and DP pulses 2+ bilaterally. No carotid bruits RESPIRATORY:  Clear to auscultation without rales, wheezing or rhonchi  ABDOMEN: Soft, non-tender, non-distended MUSCULOSKELETAL:  Ambulates independently SKIN: Warm and dry, no edema NEUROLOGIC:  Phelps and oriented x 3. No focal neuro deficits noted. PSYCHIATRIC:  Normal affect   ASSESSMENT:    1. Cardiomyopathy, unspecified type ([place])   2. Chronic combined systolic and diastolic heart failure ([facility])   3. Paroxysmal atrial fibrillation ([place])   4. Essential hypertension   5. Cardiac risk counseling   6. Counseling on health promotion and disease prevention    PLAN:    Nonischemic cardiomyopathy, suspect tachycardia-induced, with chronic systolic and diastolic heart failure:  -echo not significantly improved on recheck 01/09/20, despite GDMT -continue entresto 49-51 mg BID. -NYHA class I-II -continue carvedilol 12.5 mg BID -discussed that if we can keep her in sinus rhythm, will recheck echo to guide GDMT and possible ICD  Hypertension: slightly elevated today but typically well controlled -continue carvedilol, entresto  paroxysmal atrial fibrillation:  -continue apixaban 5 mg BID CHA2DS2/VAS Stroke Risk Points = 3  -has maintained SR since most recent cardioversion  Cardiac risk counseling and prevention recommendations: -recommend heart healthy/Mediterranean diet, with whole grains, fruits, vegetable, fish, lean meats, nuts, and olive oil. Limit salt. -recommend moderate walking, 3-5 times/week for 30-50 minutes each session. Aim for at least 150 minutes.week. Goal should be pace of 3 miles/hours, or walking 1.5 miles in 30 minutes -recommend avoidance of tobacco products. Avoid excess  alcohol. -ASCVD risk score: The 10-year ASCVD risk score Mikey Bussing DC Brooke Bonito., et al., 2013) is: 24.7%   Values used to calculate the score:     Age: 81 years     Sex: Female     Is Non-Hispanic African American: No     Diabetic: No     Tobacco smoker: Yes     Systolic Blood Pressure: 588 mmHg     Is BP treated: Yes     HDL Cholesterol: 42 mg/dL     Total Cholesterol: 172 mg/dL   -declines statin  Plan for follow up: after echo, to discuss results  Buford Dresser, MD, PhD [place]  [facility]   Medication Adjustments/Labs and Tests Ordered: Current medicines are reviewed at length with the patient today.  Concerns regarding medicines are outlined above.  Orders Placed This Encounter  Procedures  . EKG 12-Lead  . ECHOCARDIOGRAM COMPLETE   No orders of the defined types were placed in this encounter.   Patient Instructions  Medication Instructions:  Your Physician recommend you continue on your current medication as directed.    *  If you need a refill on your cardiac medications before your next appointment, please call your pharmacy*   Lab Work: None   Testing/Procedures: Your physician has requested that you have an echocardiogram in mid May. Echocardiography is a painless test that uses sound waves to create images of your heart. It provides your doctor with information about the size and shape of your heart and how well your heart's chambers and valves are working. This procedure takes approximately one hour. There are no restrictions for this procedure. [address]    Follow-Up: At [facility], you and your health needs are our priority.  As part of our continuing mission to provide you with exceptional heart care, we have created designated Provider Care Teams.  These Care Teams include your primary Cardiologist (physician) and Advanced Practice Providers (APPs -  Physician Assistants and Nurse Practitioners) who all work together to  provide you with the care you need, when you need it.  We recommend signing up for the patient portal called "MyChart".  Sign up information is provided on this After Visit Summary.  MyChart is used to connect with patients for Virtual Visits (Telemedicine).  Patients are able to view lab/test results, encounter notes, upcoming appointments, etc.  Non-urgent messages can be sent to your provider as well.   To learn more about what you can do with MyChart, go to [email or web address].    Your next appointment:   After ECHO  The format for your next appointment:   In Person  Provider:   Buford Dresser, MD      Signed, Buford Dresser, MD PhD 03/02/2020    [place]

## 2020-03-02 NOTE — Patient Instructions (Signed)
Medication Instructions:  Your Physician recommend you continue on your current medication as directed.    *If you need a refill on your cardiac medications before your next appointment, please call your pharmacy*   Lab Work: None   Testing/Procedures: Your physician has requested that you have an echocardiogram in mid May. Echocardiography is a painless test that uses sound waves to create images of your heart. It provides your doctor with information about the size and shape of your heart and how well your heart's chambers and valves are working. This procedure takes approximately one hour. There are no restrictions for this procedure. [address]    Follow-Up: At [facility], you and your health needs are our priority.  As part of our continuing mission to provide you with exceptional heart care, we have created designated Provider Care Teams.  These Care Teams include your primary Cardiologist (physician) and Advanced Practice Providers (APPs -  Physician Assistants and Nurse Practitioners) who all work together to provide you with the care you need, when you need it.  We recommend signing up for the patient portal called "MyChart".  Sign up information is provided on this After Visit Summary.  MyChart is used to connect with patients for Virtual Visits (Telemedicine).  Patients are able to view lab/test results, encounter notes, upcoming appointments, etc.  Non-urgent messages can be sent to your provider as well.   To learn more about what you can do with MyChart, go to [email or web address].    Your next appointment:   After ECHO  The format for your next appointment:   In Person  Provider:   Jodelle Red, MD

## 2020-04-21 ENCOUNTER — Encounter: Payer: Self-pay | Admitting: Cardiology

## 2020-04-26 ENCOUNTER — Ambulatory Visit (HOSPITAL_COMMUNITY): Payer: Medicare PPO | Attending: Cardiology

## 2020-04-26 ENCOUNTER — Other Ambulatory Visit: Payer: Self-pay

## 2020-04-26 DIAGNOSIS — I4891 Unspecified atrial fibrillation: Secondary | ICD-10-CM | POA: Insufficient documentation

## 2020-04-26 DIAGNOSIS — Z87891 Personal history of nicotine dependence: Secondary | ICD-10-CM | POA: Insufficient documentation

## 2020-04-26 DIAGNOSIS — I429 Cardiomyopathy, unspecified: Secondary | ICD-10-CM | POA: Insufficient documentation

## 2020-04-26 DIAGNOSIS — R7303 Prediabetes: Secondary | ICD-10-CM | POA: Insufficient documentation

## 2020-04-26 DIAGNOSIS — I509 Heart failure, unspecified: Secondary | ICD-10-CM | POA: Diagnosis not present

## 2020-04-26 DIAGNOSIS — I083 Combined rheumatic disorders of mitral, aortic and tricuspid valves: Secondary | ICD-10-CM | POA: Diagnosis not present

## 2020-05-05 ENCOUNTER — Other Ambulatory Visit: Payer: Self-pay

## 2020-05-05 ENCOUNTER — Encounter: Payer: Self-pay | Admitting: Cardiology

## 2020-05-05 ENCOUNTER — Ambulatory Visit: Payer: Medicare PPO | Admitting: Cardiology

## 2020-05-05 VITALS — BP 142/81 | HR 76 | Ht 66.0 in | Wt 189.4 lb

## 2020-05-05 DIAGNOSIS — I480 Paroxysmal atrial fibrillation: Secondary | ICD-10-CM

## 2020-05-05 DIAGNOSIS — I5042 Chronic combined systolic (congestive) and diastolic (congestive) heart failure: Secondary | ICD-10-CM | POA: Diagnosis not present

## 2020-05-05 DIAGNOSIS — I420 Dilated cardiomyopathy: Secondary | ICD-10-CM

## 2020-05-05 DIAGNOSIS — I10 Essential (primary) hypertension: Secondary | ICD-10-CM

## 2020-05-05 DIAGNOSIS — Z79899 Other long term (current) drug therapy: Secondary | ICD-10-CM

## 2020-05-05 DIAGNOSIS — Z7189 Other specified counseling: Secondary | ICD-10-CM

## 2020-05-05 NOTE — Patient Instructions (Addendum)
Medication Instructions:  Try increasing your entresto dose. Instead of taking 1 tab of 49-51mg  twice a day, take two tabs twice a day. Check your blood pressure. We want your top number to stay >100. If you have dizziness/lightheadedness, check blood pressure. If it is running low, we will go back to the prior dose.   If this two pills twice a day works well, when you are getting close to needing a refill, call and let us know and we will refill it at the higher dose  Cold medications: -nothing with ibuprofen, naproxen in it -nothing with pseudophedrine  Guafenisin (mucinex), dextromethorphan (DM in robitussion) are ok. Phenylephrine ok for short time for nasal congestion. Prefer topical nasal agents; can use oxymetolazone (Afrin) for up to 3 days, but not more due to risk of rebound congestion.    *If you need a refill on your cardiac medications before your next appointment, please call your pharmacy*   Lab Work: Your physician recommends that you return for lab work in 3 weeks ( BMP)  If you have labs (blood work) drawn today and your tests are completely normal, you will receive your results only by: Marland Kitchen MyChart Message (if you have MyChart) OR . A paper copy in the mail If you have any lab test that is abnormal or we need to change your treatment, we will call you to review the results.   Testing/Procedures: None   Follow-Up: At [facility], you and your health needs are our priority.  As part of our continuing mission to provide you with exceptional heart care, we have created designated Provider Care Teams.  These Care Teams include your primary Cardiologist (physician) and Advanced Practice Providers (APPs -  Physician Assistants and Nurse Practitioners) who all work together to provide you with the care you need, when you need it.  We recommend signing up for the patient portal called "MyChart".  Sign up information is provided on this After Visit Summary.  MyChart is used  to connect with patients for Virtual Visits (Telemedicine).  Patients are able to view lab/test results, encounter notes, upcoming appointments, etc.  Non-urgent messages can be sent to your provider as well.   To learn more about what you can do with MyChart, go to [email or web address].    Your next appointment:   3 month(s)  The format for your next appointment:   In Person  Provider:   Jodelle Red, MD

## 2020-05-05 NOTE — Progress Notes (Signed)
Cardiology Office Note:    Date:  05/05/2020   ID:  Evelyn Phelps, DOB 11/21/1951, MRN [id number]  PCP:  Patient, No Pcp Per  Cardiologist:  Buford Dresser, MD  CC: follow up  History of Present Illness:    Evelyn Phelps is a 69 y.o. female with a hx of chronic systolic and diastolic heart failure, paroxysmal atrial fibrillation who is seen in follow up. I met her during her hospitalization 09/2019.  Cardiac history: new diagnosis of acute combined systolic and diastolic heart failure, afib RVR during admission 09/2019.  She had TEE-CV during that admission. On TOC follow up was back in atrial fibrillation.  Today: Reviewed echocardiogram together. EF is 35-40%, improved from 25-30% 12/2019 but not normalized. Last ECG was NSR with LBBB.  Mother passed away recently, feels she is coping as well as can be expected. Has had a cold, discussed cold medication suggestions today. Rhinorrhea has been the worst. No fevers/chills.  BP at home have been running ok. Doesn't have log today. Rare 140s-150s, lowers on recheck. Does well with medications overall, trying to find a system to simplify meds.   Takes lasix rarely for swelling, but also notes that recently has had more dietary indiscretion. Breathing is good. Has been able to climb stairs without issues.  We discussed options for further management. Discussed staying the course, increasing entresto. See below.  Past Medical History:  Diagnosis Date  . Former tobacco use    Quit in 2018    Past Surgical History:  Procedure Laterality Date  . CARDIOVERSION N/A 10/03/2019   Procedure: CARDIOVERSION;  Surgeon: Donato Heinz, MD;  Location: [facility] ENDOSCOPY;  Service: Endoscopy;  Laterality: N/A;  . CARDIOVERSION N/A 02/03/2020   Procedure: CARDIOVERSION;  Surgeon: Buford Dresser, MD;  Location: [facility] ENDOSCOPY;  Service: Cardiovascular;  Laterality: N/A;  . LEFT HEART CATH AND CORONARY ANGIOGRAPHY N/A 10/01/2019    Procedure: LEFT HEART CATH AND CORONARY ANGIOGRAPHY;  Surgeon: Wellington Hampshire, MD;  Location: [place] CV LAB;  Service: Cardiovascular;  Laterality: N/A;  . TEE WITHOUT CARDIOVERSION N/A 10/03/2019   Procedure: TRANSESOPHAGEAL ECHOCARDIOGRAM (TEE);  Surgeon: Donato Heinz, MD;  Location: [facility] ENDOSCOPY;  Service: Endoscopy;  Laterality: N/A;    Current Medications: Current Outpatient Medications on File Prior to Visit  Medication Sig  . acetaminophen (TYLENOL) 500 MG tablet Take 500 mg by mouth every 6 (six) hours as needed (for pain.).   Marland Kitchen apixaban (ELIQUIS) 5 MG TABS tablet Take 1 tablet (5 mg total) by mouth 2 (two) times daily.  . carvedilol (COREG) 12.5 MG tablet Take 1 tablet (12.5 mg total) by mouth 2 (two) times daily with a meal.  . sacubitril-valsartan (ENTRESTO) 49-51 MG Take 1 tablet by mouth 2 (two) times daily.  . furosemide (LASIX) 40 MG tablet Take 1 tablet (40 mg total) by mouth daily as needed (weight gain of 3 lbs overnight or 5 lbs in one week).   No current facility-administered medications on file prior to visit.     Allergies:   Erythromycin   Social History   Tobacco Use  . Smoking status: Former Smoker    Quit date: 09/28/2017    Years since quitting: 2.6  . Smokeless tobacco: Never Used  Substance Use Topics  . Alcohol use: Not Currently  . Drug use: Not on file    Family History: family history includes Dementia in her father; High Cholesterol in her father; Thyroid disease in her mother.  ROS:  Please see the history of present illness.  Additional pertinent ROS negative except as noted in HPI.  EKGs/Labs/Other Studies Reviewed:    The following studies were reviewed today:  Echo 04/26/20 1. Left ventricular ejection fraction, by estimation, is 35-40%. The left ventricle has normal function. The left ventricle has no regional wall motion abnormalities. There is akinesis of the left ventricular, basal-mid anteroseptal wall and  inferoseptal wall. There is akinesis of the left ventricular, entire  inferior wall. The average left ventricular global longitudinal strain is abnormal at 14.1 %. The global longitudinal strain is normal.  2. Right ventricular systolic function is normal. The right ventricular size is normal. Tricuspid regurgitation signal is inadequate for assessing PA pressure.  3. The mitral valve is normal in structure. Mild mitral valve regurgitation. No evidence of mitral stenosis.  4. The aortic valve is tricuspid. Aortic valve regurgitation is not visualized. Mild aortic valve sclerosis is present, with no evidence of aortic valve stenosis.  5. The inferior vena cava is normal in size with greater than 50% respiratory variability, suggesting right atrial pressure of 3 mmHg.  6. Left atrial size was mildly dilated.   Echo 01/09/20  1. Left ventricular ejection fraction, by visual estimation, is 25 to 30%. The left ventricle has severely decreased function. There is mildly increased left ventricular hypertrophy.  2. Left ventricular diastolic function could not be evaluated.  3. Moderately dilated left ventricular internal cavity size.  4. The left ventricle demonstrates regional wall motion abnormalities.  5. Global right ventricle has normal systolic function.The right ventricular size is normal.  6. Left atrial size was severely dilated.  7. Right atrial size was normal.  8. Mild mitral annular calcification.  9. The mitral valve is normal in structure. Mild mitral valve regurgitation. No evidence of mitral stenosis. 10. The tricuspid valve is normal in structure. Tricuspid valve regurgitation is mild. 11. The aortic valve is tricuspid. Aortic valve regurgitation is not visualized. No evidence of aortic valve sclerosis or stenosis. 12. The pulmonic valve was normal in structure. Pulmonic valve regurgitation is trivial. 13. The inferior vena cava is normal in size with greater than 50% respiratory  variability, suggesting right atrial pressure of 3 mmHg. 14. Akinesis of the inferior wall; dyskinesis of the anteroseptal wall; overall severe LV dysfunction; mild LVH; moderate LVE; mild MR; severe LAE; mild TR.  Echo 09/29/19 1. Left ventricular ejection fraction, by visual estimation, is 25 to 30%. The left ventricle has severely decreased function. Severely increased left ventricular size. There is mildly increased left ventricular hypertrophy. 2. Left ventricular diastolic function could not be evaluated pattern of LV diastolic filling. 3. Global right ventricle has normal systolic function.The right ventricular size is normal. 4. Left atrial size was normal. 5. Right atrial size was normal. 6. Mild mitral annular calcification. 7. The mitral valve is abnormal. Mild mitral valve regurgitation. No evidence of mitral stenosis. 8. The tricuspid valve is normal in structure. Tricuspid valve regurgitation is mild. 9. The aortic valve is tricuspid Aortic valve regurgitation is trivial by color flow Doppler. Mild aortic valve sclerosis without stenosis. 10. The pulmonic valve was not well visualized. Pulmonic valve regurgitation is not visualized by color flow Doppler. 11. Mildly elevated pulmonary artery systolic pressure. 12. The inferior vena cava is dilated in size with <50% respiratory variability, suggesting right atrial pressure of 15 mmHg. 13. Akinesis of the anteroseptal, apical and inferior walls; overall severely reduced LV systolic function; severe LVE; mild LVH; trace AI; mild MR  and TR.  LHC 10/01/2019   Prox RCA lesion is 20% stenosed.  There is severe left ventricular systolic dysfunction.  LV end diastolic pressure is normal.  The left ventricular ejection fraction is 25-35% by visual estimate.  1. Mild stenosis in a small nondominant right coronary artery. Otherwise normal coronary arteries. 2. Severely reduced LV systolic function. 3. Normal left  ventricular end-diastolic pressure.  Recommendations: The patient has nonischemic cardiomyopathy likely tachycardia induced. Recommend medical therapy for cardiomyopathy. Consider TEE guided cardioversion tomorrow. I am going to start the patient on Eliquis 5 mg twice daily to be started this evening.  EKG:  EKG is personally reviewed.  The ekg ordered 03/02/20 demonstrates NSR with LBBB  Recent Labs: 09/28/2019: ALT 38; B Natriuretic Peptide 1,325.1; TSH 1.459 09/30/2019: Magnesium 1.8 01/27/2020: BUN 25; Creatinine, Ser 0.88; Hemoglobin 13.2; Platelets 183; Potassium 4.5; Sodium 142  Recent Lipid Panel    Component Value Date/Time   CHOL 172 09/30/2019 0527   TRIG 83 09/30/2019 0527   HDL 42 09/30/2019 0527   CHOLHDL 4.1 09/30/2019 0527   VLDL 17 09/30/2019 0527   LDLCALC 113 (H) 09/30/2019 0527    Physical Exam:    VS:  BP (!) 142/81   Pulse 76   Ht 5' 6"  (1.676 m)   Wt 189 lb 6.4 oz (85.9 kg)   SpO2 98%   BMI 30.57 kg/m     Wt Readings from Last 3 Encounters:  05/05/20 189 lb 6.4 oz (85.9 kg)  03/02/20 193 lb 1.6 oz (87.6 kg)  02/03/20 191 lb (86.6 kg)    GEN: Well nourished, well developed in no acute distress HEENT: Normal, moist mucous membranes NECK: No JVD CARDIAC: regular rhythm, normal S1 and S2, no rubs or gallops. No murmur. VASCULAR: Radial and DP pulses 2+ bilaterally. No carotid bruits RESPIRATORY:  Clear to auscultation without rales, wheezing or rhonchi  ABDOMEN: Soft, non-tender, non-distended MUSCULOSKELETAL:  Ambulates independently SKIN: Warm and dry, no edema NEUROLOGIC:  Phelps and oriented x 3. No focal neuro deficits noted. PSYCHIATRIC:  Normal affect   ASSESSMENT:    1. Dilated cardiomyopathy ([place])   2. Medication management   3. Chronic combined systolic and diastolic heart failure (HCC)   4. Paroxysmal atrial fibrillation ([place])   5. Essential hypertension   6. Cardiac risk counseling   7. Counseling on health promotion and disease  prevention    PLAN:    Nonischemic cardiomyopathy, with chronic systolic and diastolic heart failure:  -echo has been very slow to improve despite NSR. Initially thought to be tachycardia induced, may have another component of NICM -we discussed today. With BP room will try to increase entresto from 49-51 mg BID to highest dose. Given instructions, below -NYHA class I-II -continue carvedilol 12.5 mg BID -EF above ICD level, but may need CRT in the future given LBBB. She would like to wait on this for now -recheck BMET 3 weeks given entresto increase -if she tolerates, will refill at the higher entresto dose -rare lasix use  Hypertension: above goal -continue carvedilol, increasing entresto as above  paroxysmal atrial fibrillation:  -continue apixaban 5 mg BID CHA2DS2/VAS Stroke Risk Points = 3  -has maintained SR since most recent cardioversion  Cardiac risk counseling and prevention recommendations: -recommend heart healthy/Mediterranean diet, with whole grains, fruits, vegetable, fish, lean meats, nuts, and olive oil. Limit salt. -recommend moderate walking, 3-5 times/week for 30-50 minutes each session. Aim for at least 150 minutes.week. Goal should be pace of 3  miles/hours, or walking 1.5 miles in 30 minutes -recommend avoidance of tobacco products. Avoid excess alcohol. -ASCVD risk score: The 10-year ASCVD risk score Mikey Bussing DC Brooke Bonito., et al., 2013) is: 24.1%   Values used to calculate the score:     Age: 38 years     Sex: Female     Is Non-Hispanic African American: No     Diabetic: No     Tobacco smoker: Yes     Systolic Blood Pressure: 381 mmHg     Is BP treated: Yes     HDL Cholesterol: 42 mg/dL     Total Cholesterol: 172 mg/dL   -declines statin  Plan for follow up: 3 mos or sooner PRN  Buford Dresser, MD, PhD [place]  [facility]   Medication Adjustments/Labs and Tests Ordered: Current medicines are reviewed at length with the patient today.   Concerns regarding medicines are outlined above.  Orders Placed This Encounter  Procedures  . Basic metabolic panel   No orders of the defined types were placed in this encounter.   Patient Instructions  Medication Instructions:  Try increasing your entresto dose. Instead of taking 1 tab of 49-77m twice a day, take two tabs twice a day. Check your blood pressure. We want your top number to stay >100. If you have dizziness/lightheadedness, check blood pressure. If it is running low, we will go back to the prior dose.   If this two pills twice a day works well, when you are getting close to needing a refill, call and let u[place]ore[place]now and we will refill it at the higher dose  Cold medications: -nothing with ibuprofen, naproxen in it -nothing with pseudophedrine  Guafenisin (mucinex), dextromethorphan (DM in robitussion) are ok. Phenylephrine ok for short time for nasal congestion. Prefer topical nasal agents; can use oxymetolazone (Afrin) for up to 3 days, but not more due to risk of rebound congestion.    *If you need a refill on your cardiac medications before your next appointment, please call your pharmacy*   Lab Work: Your physician recommends that you return for lab work in 3 weeks ( BMP)  If you have labs (blood work) drawn today and your tests are completely normal, you will receive your results only by: .Marland KitchenMyChart Message (if you have MyChart) OR . A paper copy in the mail If you have any lab test that is abnormal or we need to change your treatment, we will call you to review the results.   Testing/Procedures: None   Follow-Up: At [facility] you and your health needs are our priority.  As part of our continuing mission to provide you with exceptional heart care, we have created designated Provider Care Teams.  These Care Teams include your primary Cardiologist (physician) and Advanced Practice Providers (APPs -  Physician Assistants and Nurse Practitioners) who all work  together to provide you with the care you need, when you need it.  We recommend signing up for the patient portal called "MyChart".  Sign up information is provided on this After Visit Summary.  MyChart is used to connect with patients for Virtual Visits (Telemedicine).  Patients are able to view lab/test results, encounter notes, upcoming appointments, etc.  Non-urgent messages can be sent to your provider as well.   To learn more about what you can do with MyChart, go to [email or web address]    Your next appointment:   3 month(s)  The format for your next appointment:   In Person  Provider:  Buford Dresser, MD      Signed, Buford Dresser, MD PhD 05/05/2020    [place]

## 2020-05-12 ENCOUNTER — Encounter: Payer: Self-pay | Admitting: Cardiology

## 2020-05-12 DIAGNOSIS — I420 Dilated cardiomyopathy: Secondary | ICD-10-CM | POA: Insufficient documentation

## 2020-05-26 LAB — BASIC METABOLIC PANEL
BUN/Creatinine Ratio: 31 — ABNORMAL HIGH (ref 12–28)
BUN: 23 mg/dL (ref 8–27)
CO2: 27 mmol/L (ref 20–29)
Calcium: 8.9 mg/dL (ref 8.7–10.3)
Chloride: 103 mmol/L (ref 96–106)
Creatinine, Ser: 0.75 mg/dL (ref 0.57–1.00)
GFR calc Af Amer: 93 mL/min/{1.73_m2} (ref >59)
GFR calc non Af Amer: 80 mL/min/{1.73_m2} (ref >59)
Glucose: 94 mg/dL (ref 65–99)
Potassium: 4.5 mmol/L (ref 3.5–5.2)
Sodium: 140 mmol/L (ref 134–144)

## 2020-05-27 ENCOUNTER — Other Ambulatory Visit: Payer: Self-pay | Admitting: Cardiology

## 2020-05-27 DIAGNOSIS — I5042 Chronic combined systolic (congestive) and diastolic (congestive) heart failure: Secondary | ICD-10-CM

## 2020-05-27 DIAGNOSIS — I43 Cardiomyopathy in diseases classified elsewhere: Secondary | ICD-10-CM

## 2020-05-27 MED ORDER — ENTRESTO 49-51 MG PO TABS: 1.0000 | ORAL_TABLET | Freq: Two times a day (BID) | ORAL | 1 refills | Status: DC

## 2020-05-27 MED ORDER — ENTRESTO 49-51 MG PO TABS: 2.0000 | ORAL_TABLET | Freq: Two times a day (BID) | ORAL | 1 refills | Status: DC

## 2020-05-27 NOTE — Telephone Encounter (Signed)
 *  STAT* If patient is at the pharmacy, call can be transferred to refill team.   1. Which medications need to be refilled? (please list name of each medication and dose if known)  sacubitril-valsartan (ENTRESTO) 49-51 MG  2. Which pharmacy/location (including street and city if local pharmacy) is medication to be sent to? [id number] - [place], [place] - [address] AT [address]  3. Do they need a 30 day or 90 day supply? 90  Patient states that at last visit Dr Cristal Deer upped her dosage to take 2 tablets twice daily. Dr Cristal Deer told her that the new prescription would be refilled at a higher dose.

## 2020-05-27 NOTE — Addendum Note (Signed)
Addended by: Raelyn Number on: 05/27/2020 04:24 PM   Modules accepted: Orders

## 2020-05-28 ENCOUNTER — Telehealth: Payer: Self-pay | Admitting: Cardiology

## 2020-05-28 MED ORDER — ENTRESTO 97-103 MG PO TABS: 1.0000 | ORAL_TABLET | Freq: Two times a day (BID) | ORAL | 3 refills | Status: DC

## 2020-05-28 NOTE — Telephone Encounter (Signed)
Patient called stating that the medication refill that was called in for [facility] yesterday for her was wrong it should have been called in for a higher dose.  She stated that when she was in the last time Dr. Cristal Deer told her if she was doing fine at the currently dose she would increase it.  Patient only has enough medication to last her until tomorrow morning.

## 2020-05-28 NOTE — Telephone Encounter (Signed)
Spoke with pt who report she is tolerating the increased dose of Entresto 49-51 mg 2 tablets BID. She report per Dr. Cristal Deer, if able to tolerate, a new prescription for Entresto 97-103 mg would be sent to the pharmacy so she would only have to take 1 tablet. Nurse reviewed the chart and sent in new prescription.

## 2020-08-18 ENCOUNTER — Ambulatory Visit: Payer: Medicare PPO | Admitting: Cardiology

## 2020-08-18 ENCOUNTER — Encounter: Payer: Self-pay | Admitting: Cardiology

## 2020-08-18 ENCOUNTER — Other Ambulatory Visit: Payer: Self-pay

## 2020-08-18 VITALS — BP 108/72 | HR 80 | Temp 97.1°F | Ht 66.0 in | Wt 195.6 lb

## 2020-08-18 DIAGNOSIS — I420 Dilated cardiomyopathy: Secondary | ICD-10-CM

## 2020-08-18 DIAGNOSIS — Z7189 Other specified counseling: Secondary | ICD-10-CM

## 2020-08-18 DIAGNOSIS — I5042 Chronic combined systolic (congestive) and diastolic (congestive) heart failure: Secondary | ICD-10-CM | POA: Diagnosis not present

## 2020-08-18 DIAGNOSIS — I10 Essential (primary) hypertension: Secondary | ICD-10-CM

## 2020-08-18 DIAGNOSIS — Z131 Encounter for screening for diabetes mellitus: Secondary | ICD-10-CM

## 2020-08-18 DIAGNOSIS — I480 Paroxysmal atrial fibrillation: Secondary | ICD-10-CM | POA: Diagnosis not present

## 2020-08-18 NOTE — Progress Notes (Signed)
Cardiology Office Note:    Date:  08/18/2020   ID:  [name], DOB [birth year], MRN [id number]  PCP:  Patient, No Pcp Per  Cardiologist:  Buford Dresser, MD  CC: follow up  History of Present Illness:    Evelyn Phelps is a 69 y.o. female with a hx of chronic systolic and diastolic heart failure, paroxysmal atrial fibrillation who is seen in follow up. I met her during her hospitalization 09/2019.  Cardiac history: new diagnosis of acute combined systolic and diastolic heart failure, afib RVR during admission 09/2019.  She had TEE-CV during that admission. On TOC follow up was back in atrial fibrillation. S/P repeat cardioversion, maintaining sinus rhythm though EF remains reduced.  Today: Feeling well. Working on cleaning out her mother's house since she passed away.   No high/low blood pressures recently. Had rare bradycardia on the monitor into the upper 40s/low 50s, but improved with walking. No symptoms.  Breathing stable other than not tolerating the heat well. Weight up slightly today (2 lbs) and ankles slightly swollen today, had salty food over the weekend, plans to take fluid pill later today. Hasn't had to take in 6 weeks.  Has been climbing up and down stairs, moving items, etc. Does feel short of breath with this but doesn't need to stop.  We spent time today discussing role of SGLT2i, when CRT might be helpful, etc.  Denies chest pain, shortness of breath at rest or with normal exertion. No PND, orthopnea, LE edema or unexpected weight gain. No syncope or palpitations.  Past Medical History:  Diagnosis Date  . Former tobacco use    Quit in 2018    Past Surgical History:  Procedure Laterality Date  . CARDIOVERSION N/A 10/03/2019   Procedure: CARDIOVERSION;  Surgeon: Donato Heinz, MD;  Location: [facility] ENDOSCOPY;  Service: Endoscopy;  Laterality: N/A;  . CARDIOVERSION N/A 02/03/2020   Procedure: CARDIOVERSION;  Surgeon: Buford Dresser, MD;   Location: [facility] ENDOSCOPY;  Service: Cardiovascular;  Laterality: N/A;  . LEFT HEART CATH AND CORONARY ANGIOGRAPHY N/A 10/01/2019   Procedure: LEFT HEART CATH AND CORONARY ANGIOGRAPHY;  Surgeon: Wellington Hampshire, MD;  Location: [place] CV LAB;  Service: Cardiovascular;  Laterality: N/A;  . TEE WITHOUT CARDIOVERSION N/A 10/03/2019   Procedure: TRANSESOPHAGEAL ECHOCARDIOGRAM (TEE);  Surgeon: Donato Heinz, MD;  Location: [facility] ENDOSCOPY;  Service: Endoscopy;  Laterality: N/A;    Current Medications: Current Outpatient Medications on File Prior to Visit  Medication Sig  . acetaminophen (TYLENOL) 500 MG tablet Take 500 mg by mouth every 6 (six) hours as needed (for pain.).   Marland Kitchen apixaban (ELIQUIS) 5 MG TABS tablet Take 1 tablet (5 mg total) by mouth 2 (two) times daily.  . carvedilol (COREG) 12.5 MG tablet Take 1 tablet (12.5 mg total) by mouth 2 (two) times daily with a meal.  . sacubitril-valsartan (ENTRESTO) 97-103 MG Take 1 tablet by mouth 2 (two) times daily.  . furosemide (LASIX) 40 MG tablet Take 1 tablet (40 mg total) by mouth daily as needed (weight gain of 3 lbs overnight or 5 lbs in one week).   No current facility-administered medications on file prior to visit.     Allergies:   Erythromycin   Social History   Tobacco Use  . Smoking status: Former Smoker    Quit date: 09/28/2017    Years since quitting: 2.8  . Smokeless tobacco: Never Used  Substance Use Topics  . Alcohol use: Not Currently  . Drug  use: Not on file    Family History: family history includes Dementia in her father; High Cholesterol in her father; Thyroid disease in her mother.  ROS:   Please see the history of present illness.  Additional pertinent ROS negative except as noted in HPI.  EKGs/Labs/Other Studies Reviewed:    The following studies were reviewed today:  Echo 04/26/20 1. Left ventricular ejection fraction, by estimation, is 35-40%. The left ventricle has normal function. The left  ventricle has no regional wall motion abnormalities. There is akinesis of the left ventricular, basal-mid anteroseptal wall and inferoseptal wall. There is akinesis of the left ventricular, entire  inferior wall. The average left ventricular global longitudinal strain is abnormal at 14.1 %. The global longitudinal strain is normal.  2. Right ventricular systolic function is normal. The right ventricular size is normal. Tricuspid regurgitation signal is inadequate for assessing PA pressure.  3. The mitral valve is normal in structure. Mild mitral valve regurgitation. No evidence of mitral stenosis.  4. The aortic valve is tricuspid. Aortic valve regurgitation is not visualized. Mild aortic valve sclerosis is present, with no evidence of aortic valve stenosis.  5. The inferior vena cava is normal in size with greater than 50% respiratory variability, suggesting right atrial pressure of 3 mmHg.  6. Left atrial size was mildly dilated.   Echo 01/09/20  1. Left ventricular ejection fraction, by visual estimation, is 25 to 30%. The left ventricle has severely decreased function. There is mildly increased left ventricular hypertrophy.  2. Left ventricular diastolic function could not be evaluated.  3. Moderately dilated left ventricular internal cavity size.  4. The left ventricle demonstrates regional wall motion abnormalities.  5. Global right ventricle has normal systolic function.The right ventricular size is normal.  6. Left atrial size was severely dilated.  7. Right atrial size was normal.  8. Mild mitral annular calcification.  9. The mitral valve is normal in structure. Mild mitral valve regurgitation. No evidence of mitral stenosis. 10. The tricuspid valve is normal in structure. Tricuspid valve regurgitation is mild. 11. The aortic valve is tricuspid. Aortic valve regurgitation is not visualized. No evidence of aortic valve sclerosis or stenosis. 12. The pulmonic valve was normal in  structure. Pulmonic valve regurgitation is trivial. 13. The inferior vena cava is normal in size with greater than 50% respiratory variability, suggesting right atrial pressure of 3 mmHg. 14. Akinesis of the inferior wall; dyskinesis of the anteroseptal wall; overall severe LV dysfunction; mild LVH; moderate LVE; mild MR; severe LAE; mild TR.  Echo 09/29/19 1. Left ventricular ejection fraction, by visual estimation, is 25 to 30%. The left ventricle has severely decreased function. Severely increased left ventricular size. There is mildly increased left ventricular hypertrophy. 2. Left ventricular diastolic function could not be evaluated pattern of LV diastolic filling. 3. Global right ventricle has normal systolic function.The right ventricular size is normal. 4. Left atrial size was normal. 5. Right atrial size was normal. 6. Mild mitral annular calcification. 7. The mitral valve is abnormal. Mild mitral valve regurgitation. No evidence of mitral stenosis. 8. The tricuspid valve is normal in structure. Tricuspid valve regurgitation is mild. 9. The aortic valve is tricuspid Aortic valve regurgitation is trivial by color flow Doppler. Mild aortic valve sclerosis without stenosis. 10. The pulmonic valve was not well visualized. Pulmonic valve regurgitation is not visualized by color flow Doppler. 11. Mildly elevated pulmonary artery systolic pressure. 12. The inferior vena cava is dilated in size with <50% respiratory variability,  suggesting right atrial pressure of 15 mmHg. 13. Akinesis of the anteroseptal, apical and inferior walls; overall severely reduced LV systolic function; severe LVE; mild LVH; trace AI; mild MR and TR.  LHC 10/01/2019   Prox RCA lesion is 20% stenosed.  There is severe left ventricular systolic dysfunction.  LV end diastolic pressure is normal.  The left ventricular ejection fraction is 25-35% by visual estimate.  1. Mild stenosis in a small  nondominant right coronary artery. Otherwise normal coronary arteries. 2. Severely reduced LV systolic function. 3. Normal left ventricular end-diastolic pressure.  Recommendations: The patient has nonischemic cardiomyopathy likely tachycardia induced. Recommend medical therapy for cardiomyopathy. Consider TEE guided cardioversion tomorrow. I am going to start the patient on Eliquis 5 mg twice daily to be started this evening.  EKG:  EKG is personally reviewed.  The ekg ordered 03/02/20 demonstrates NSR with LBBB  Recent Labs: 09/28/2019: ALT 38; B Natriuretic Peptide 1,325.1; TSH 1.459 09/30/2019: Magnesium 1.8 01/27/2020: Hemoglobin 13.2; Platelets 183 05/26/2020: BUN 23; Creatinine, Ser 0.75; Potassium 4.5; Sodium 140  Recent Lipid Panel    Component Value Date/Time   CHOL 172 09/30/2019 0527   TRIG 83 09/30/2019 0527   HDL 42 09/30/2019 0527   CHOLHDL 4.1 09/30/2019 0527   VLDL 17 09/30/2019 0527   LDLCALC 113 (H) 09/30/2019 0527    Physical Exam:    VS:  BP 108/72 (BP Location: Left Arm, Patient Position: Sitting, Cuff Size: Large)   Pulse 80   Temp (!) 97.1 F (36.2 C)   Ht _0  (1.676 m)   Wt 195 lb 9.6 oz (88.7 kg)   BMI 31.57 kg/m     Wt Readings from Last 3 Encounters:  08/18/20 195 lb 9.6 oz (88.7 kg)  05/05/20 189 lb 6.4 oz (85.9 kg)  03/02/20 193 lb 1.6 oz (87.6 kg)    GEN: Well nourished, well developed in no acute distress HEENT: Normal, moist mucous membranes NECK: No JVD CARDIAC: regular rhythm, normal S1 and S2, no rubs or gallops. No murmur. VASCULAR: Radial and DP pulses 2+ bilaterally. No carotid bruits RESPIRATORY:  Clear to auscultation without rales, wheezing or rhonchi  ABDOMEN: Soft, non-tender, non-distended MUSCULOSKELETAL:  Ambulates independently SKIN: Warm and dry, trivial bilateral LE edema NEUROLOGIC:  Alert and oriented x 3. No focal neuro deficits noted. PSYCHIATRIC:  Normal affect   ASSESSMENT:    1. Dilated cardiomyopathy  ([place])   2. Chronic combined systolic and diastolic heart failure (HCC)   3. Paroxysmal atrial fibrillation ([place])   4. Essential hypertension   5. Cardiac risk counseling   6. Counseling on health promotion and disease prevention   7. Encounter for screening for diabetes mellitus   8. Encounter for education about heart failure    PLAN:    Nonischemic cardiomyopathy, with chronic systolic and diastolic heart failure:  -echo has been very slow to improve despite NSR. Initially thought to be tachycardia induced, may have another component of NICM -NYHA class I-II -continue carvedilol 12.5 mg BID -continue entresto 97-103 mg BID -EF above ICD level, but may need CRT in the future given LBBB. We discussed this today. QRS last was 174 ms. If she is on GDMT and develops NYHA class 3 symptoms, would refer to EP. -rare lasix use -discussed SGLT2i today. Vania Rea is not on formulary, Wilder Glade not preferred. Recommended Invokana (canagliflozin) based on insurance, however only [place] and [place] have data for heart failure in patients without type II diabetes. -will get A1c with labs  today. Last was 6.0. If >6.5, would consider invokana based on her insurance coverage. -recheck BMET given potential start of SGLT2i and current use of entresto -she states that she will never take a statin as she feels this contributed to her father's worsening health. Last lipids 09/30/19 reviewed.  Hypertension: at goal -continue carvedilol -continue entresto  paroxysmal atrial fibrillation:  -continue apixaban 5 mg BID CHA2DS2/VAS Stroke Risk Points = 3  -has maintained SR since most recent cardioversion  Cardiac risk counseling and prevention recommendations: -recommend heart healthy/Mediterranean diet, with whole grains, fruits, vegetable, fish, lean meats, nuts, and olive oil. Limit salt. -recommend moderate walking, 3-5 times/week for 30-50 minutes each session. Aim for at least 150 minutes.week. Goal  should be pace of 3 miles/hours, or walking 1.5 miles in 30 minutes -recommend avoidance of tobacco products. Avoid excess alcohol. -ASCVD risk score: The 10-year ASCVD risk score Mikey Bussing DC Brooke Bonito., et al., 2013) is: 10.2%   Values used to calculate the score:     Age: 67 years     Sex: Female     Is Non-Hispanic African American: No     Diabetic: No     Tobacco smoker: No     Systolic Blood Pressure: 628 mmHg     Is BP treated: Yes     HDL Cholesterol: 42 mg/dL     Total Cholesterol: 172 mg/dL   -declines statin  Plan for follow up: 6 mos or sooner PRN  Total time of encounter: 41 minutes total time of encounter, including 33 minutes spent in face-to-face patient care. This time includes coordination of care and counseling regarding education on heart failure management. Remainder of non-face-to-face time involved reviewing chart documents/testing relevant to the patient encounter and documentation in the medical record.  Buford Dresser, MD, PhD [place]  [facility]   Medication Adjustments/Labs and Tests Ordered: Current medicines are reviewed at length with the patient today.  Concerns regarding medicines are outlined above.  Orders Placed This Encounter  Procedures  . Basic Metabolic Panel (BMET)  . HgB A1c   No orders of the defined types were placed in this encounter.   Patient Instructions  Medication Instructions:  *If you need a refill on your cardiac medications before your next appointment, please call your pharmacy*   Lab Work: If you have labs (blood work) drawn today and your tests are completely normal, you will receive your results only by: Marland Kitchen MyChart Message (if you have MyChart) OR . A paper copy in the mail If you have any lab test that is abnormal or we need to change your treatment, we will call you to review the results.   Testing/Procedures: Blood work today   Follow-Up: At [facility], you and your health needs are our priority.   As part of our continuing mission to provide you with exceptional heart care, we have created designated Provider Care Teams.  These Care Teams include your primary Cardiologist (physician) and Advanced Practice Providers (APPs -  Physician Assistants and Nurse Practitioners) who all work together to provide you with the care you need, when you need it.  Your next appointment:   6 mos  The format for your next appointment:   In Person  Provider:   Buford Dresser, MD   Other Instructions None   Signed, Buford Dresser, MD PhD 08/18/2020    [place]

## 2020-08-18 NOTE — Patient Instructions (Signed)
Medication Instructions:  *If you need a refill on your cardiac medications before your next appointment, please call your pharmacy*   Lab Work: If you have labs (blood work) drawn today and your tests are completely normal, you will receive your results only by: Marland Kitchen MyChart Message (if you have MyChart) OR . A paper copy in the mail If you have any lab test that is abnormal or we need to change your treatment, we will call you to review the results.   Testing/Procedures: Blood work today   Follow-Up: At [facility], you and your health needs are our priority.  As part of our continuing mission to provide you with exceptional heart care, we have created designated Provider Care Teams.  These Care Teams include your primary Cardiologist (physician) and Advanced Practice Providers (APPs -  Physician Assistants and Nurse Practitioners) who all work together to provide you with the care you need, when you need it.  Your next appointment:   6 mos  The format for your next appointment:   In Person  Provider:   Jodelle Red, MD   Other Instructions None

## 2020-08-19 LAB — BASIC METABOLIC PANEL
BUN/Creatinine Ratio: 31 — ABNORMAL HIGH (ref 12–28)
BUN: 28 mg/dL — ABNORMAL HIGH (ref 8–27)
CO2: 26 mmol/L (ref 20–29)
Calcium: 9.3 mg/dL (ref 8.7–10.3)
Chloride: 104 mmol/L (ref 96–106)
Creatinine, Ser: 0.91 mg/dL (ref 0.57–1.00)
GFR calc Af Amer: 73 mL/min/{1.73_m2} (ref >59)
GFR calc non Af Amer: 64 mL/min/{1.73_m2} (ref >59)
Glucose: 89 mg/dL (ref 65–99)
Potassium: 4.9 mmol/L (ref 3.5–5.2)
Sodium: 141 mmol/L (ref 134–144)

## 2020-08-19 LAB — HEMOGLOBIN A1C
Est. average glucose Bld gHb Est-mCnc: 128 mg/dL
Hgb A1c MFr Bld: 6.1 % — ABNORMAL HIGH (ref 4.8–5.6)

## 2020-10-18 ENCOUNTER — Other Ambulatory Visit: Payer: Self-pay | Admitting: Adult Health

## 2020-10-18 DIAGNOSIS — I4819 Other persistent atrial fibrillation: Secondary | ICD-10-CM

## 2020-10-18 NOTE — Telephone Encounter (Signed)
Prescription refill request for Eliquis received. Indication: a fib Last office visit: Scr: 0.91 Age: 69 Weight: 88kg

## 2020-10-20 ENCOUNTER — Other Ambulatory Visit: Payer: Self-pay | Admitting: Cardiology

## 2020-10-20 NOTE — Telephone Encounter (Signed)
°*  STAT* If patient is at the pharmacy, call can be transferred to refill team.   1. Which medications need to be refilled? (please list name of each medication and dose if known) carvedilol (COREG) 12.5 MG tablet  2. Which pharmacy/location (including street and city if local pharmacy) is medication to be sent to? [id number] - [place], [place] - [address] AT [address]  3. Do they need a 30 day or 90 day supply? 90

## 2020-10-21 MED ORDER — CARVEDILOL 12.5 MG PO TABS: 12.5000 mg | ORAL_TABLET | Freq: Two times a day (BID) | ORAL | 3 refills | Status: DC

## 2021-02-21 ENCOUNTER — Ambulatory Visit: Payer: Medicare PPO | Admitting: Cardiology

## 2021-03-02 ENCOUNTER — Encounter: Payer: Self-pay | Admitting: Cardiology

## 2021-03-02 ENCOUNTER — Other Ambulatory Visit: Payer: Self-pay

## 2021-03-02 ENCOUNTER — Ambulatory Visit: Payer: Medicare PPO | Admitting: Cardiology

## 2021-03-02 VITALS — BP 121/72 | HR 69 | Ht 66.0 in | Wt 201.6 lb

## 2021-03-02 DIAGNOSIS — I420 Dilated cardiomyopathy: Secondary | ICD-10-CM

## 2021-03-02 DIAGNOSIS — I480 Paroxysmal atrial fibrillation: Secondary | ICD-10-CM | POA: Diagnosis not present

## 2021-03-02 DIAGNOSIS — I5042 Chronic combined systolic (congestive) and diastolic (congestive) heart failure: Secondary | ICD-10-CM

## 2021-03-02 DIAGNOSIS — Z7189 Other specified counseling: Secondary | ICD-10-CM

## 2021-03-02 MED ORDER — FUROSEMIDE 40 MG PO TABS: 40.0000 mg | ORAL_TABLET | Freq: Every day | ORAL | 11 refills | Status: DC | PRN

## 2021-03-02 NOTE — Progress Notes (Signed)
Cardiology Office Note:    Date:  03/02/2021   ID:  Evelyn Phelps, DOB 1951/11/09, MRN [id number]  PCP:  Patient, No Pcp Per  Cardiologist:  Buford Dresser, MD  CC: follow up  History of Present Illness:    Evelyn Phelps is a 70 y.o. female with a hx of chronic systolic and diastolic heart failure, nonischemic cardiomyopathy, LBBB, paroxysmal atrial fibrillation who is seen in follow up. I met her during her hospitalization 09/2019.  Cardiac history: new diagnosis of acute combined systolic and diastolic heart failure, afib RVR during admission 09/2019.  She had TEE-CV during that admission. On TOC follow up was back in atrial fibrillation. S/P repeat cardioversion, maintaining sinus rhythm though EF remains reduced.  Today: Doing well overall. Remains in sinus rhythm. Tolerating meds well. Needs furosemide only very rarely.  Asking for a handicap placard, discussed there are specific requirements for this and I cannot write for this for her based on the requirements.   Continues to have some shortness of breath with exertion. Able to dig up items in her yard, doing yardwork. Stays at home, doesn't go out much.   Denies chest pain, shortness of breath at rest. No PND, orthopnea, LE edema or unexpected weight gain. No syncope or palpitations.  Past Medical History:  Diagnosis Date  . Former tobacco use    Quit in 2018    Past Surgical History:  Procedure Laterality Date  . CARDIOVERSION N/A 10/03/2019   Procedure: CARDIOVERSION;  Surgeon: Donato Heinz, MD;  Location: [facility] ENDOSCOPY;  Service: Endoscopy;  Laterality: N/A;  . CARDIOVERSION N/A 02/03/2020   Procedure: CARDIOVERSION;  Surgeon: Buford Dresser, MD;  Location: [facility] ENDOSCOPY;  Service: Cardiovascular;  Laterality: N/A;  . LEFT HEART CATH AND CORONARY ANGIOGRAPHY N/A 10/01/2019   Procedure: LEFT HEART CATH AND CORONARY ANGIOGRAPHY;  Surgeon: Wellington Hampshire, MD;  Location: [place] CV LAB;   Service: Cardiovascular;  Laterality: N/A;  . TEE WITHOUT CARDIOVERSION N/A 10/03/2019   Procedure: TRANSESOPHAGEAL ECHOCARDIOGRAM (TEE);  Surgeon: Donato Heinz, MD;  Location: [facility] ENDOSCOPY;  Service: Endoscopy;  Laterality: N/A;    Current Medications: Current Outpatient Medications on File Prior to Visit  Medication Sig  . acetaminophen (TYLENOL) 500 MG tablet Take 500 mg by mouth every 6 (six) hours as needed (for pain.).   Marland Kitchen carvedilol (COREG) 12.5 MG tablet Take 1 tablet (12.5 mg total) by mouth 2 (two) times daily with a meal.  . ELIQUIS 5 MG TABS tablet TAKE 1 TABLET BY MOUTH TWICE A DAY  . sacubitril-valsartan (ENTRESTO) 97-103 MG Take 1 tablet by mouth 2 (two) times daily.   No current facility-administered medications on file prior to visit.     Allergies:   Erythromycin   Social History   Tobacco Use  . Smoking status: Former Smoker    Quit date: 09/28/2017    Years since quitting: 3.4  . Smokeless tobacco: Never Used  Substance Use Topics  . Alcohol use: Not Currently    Family History: family history includes Dementia in her father; High Cholesterol in her father; Thyroid disease in her mother.  ROS:   Please see the history of present illness.  Additional pertinent ROS negative except as noted in HPI.  EKGs/Labs/Other Studies Reviewed:    The following studies were reviewed today:  Echo 04/26/20 1. Left ventricular ejection fraction, by estimation, is 35-40%. The left ventricle has normal function. The left ventricle has no regional wall motion abnormalities. There is akinesis  of the left ventricular, basal-mid anteroseptal wall and inferoseptal wall. There is akinesis of the left ventricular, entire  inferior wall. The average left ventricular global longitudinal strain is abnormal at 14.1 %. The global longitudinal strain is normal.  2. Right ventricular systolic function is normal. The right ventricular size is normal. Tricuspid regurgitation  signal is inadequate for assessing PA pressure.  3. The mitral valve is normal in structure. Mild mitral valve regurgitation. No evidence of mitral stenosis.  4. The aortic valve is tricuspid. Aortic valve regurgitation is not visualized. Mild aortic valve sclerosis is present, with no evidence of aortic valve stenosis.  5. The inferior vena cava is normal in size with greater than 50% respiratory variability, suggesting right atrial pressure of 3 mmHg.  6. Left atrial size was mildly dilated.   Echo 01/09/20  1. Left ventricular ejection fraction, by visual estimation, is 25 to 30%. The left ventricle has severely decreased function. There is mildly increased left ventricular hypertrophy.  2. Left ventricular diastolic function could not be evaluated.  3. Moderately dilated left ventricular internal cavity size.  4. The left ventricle demonstrates regional wall motion abnormalities.  5. Global right ventricle has normal systolic function.The right ventricular size is normal.  6. Left atrial size was severely dilated.  7. Right atrial size was normal.  8. Mild mitral annular calcification.  9. The mitral valve is normal in structure. Mild mitral valve regurgitation. No evidence of mitral stenosis. 10. The tricuspid valve is normal in structure. Tricuspid valve regurgitation is mild. 11. The aortic valve is tricuspid. Aortic valve regurgitation is not visualized. No evidence of aortic valve sclerosis or stenosis. 12. The pulmonic valve was normal in structure. Pulmonic valve regurgitation is trivial. 13. The inferior vena cava is normal in size with greater than 50% respiratory variability, suggesting right atrial pressure of 3 mmHg. 14. Akinesis of the inferior wall; dyskinesis of the anteroseptal wall; overall severe LV dysfunction; mild LVH; moderate LVE; mild MR; severe LAE; mild TR.  Echo 09/29/19 1. Left ventricular ejection fraction, by visual estimation, is 25 to 30%. The left  ventricle has severely decreased function. Severely increased left ventricular size. There is mildly increased left ventricular hypertrophy. 2. Left ventricular diastolic function could not be evaluated pattern of LV diastolic filling. 3. Global right ventricle has normal systolic function.The right ventricular size is normal. 4. Left atrial size was normal. 5. Right atrial size was normal. 6. Mild mitral annular calcification. 7. The mitral valve is abnormal. Mild mitral valve regurgitation. No evidence of mitral stenosis. 8. The tricuspid valve is normal in structure. Tricuspid valve regurgitation is mild. 9. The aortic valve is tricuspid Aortic valve regurgitation is trivial by color flow Doppler. Mild aortic valve sclerosis without stenosis. 10. The pulmonic valve was not well visualized. Pulmonic valve regurgitation is not visualized by color flow Doppler. 11. Mildly elevated pulmonary artery systolic pressure. 12. The inferior vena cava is dilated in size with <50% respiratory variability, suggesting right atrial pressure of 15 mmHg. 13. Akinesis of the anteroseptal, apical and inferior walls; overall severely reduced LV systolic function; severe LVE; mild LVH; trace AI; mild MR and TR.  LHC 10/01/2019   Prox RCA lesion is 20% stenosed.  There is severe left ventricular systolic dysfunction.  LV end diastolic pressure is normal.  The left ventricular ejection fraction is 25-35% by visual estimate.  1. Mild stenosis in a small nondominant right coronary artery. Otherwise normal coronary arteries. 2. Severely reduced LV systolic function.  3. Normal left ventricular end-diastolic pressure.  Recommendations: The patient has nonischemic cardiomyopathy likely tachycardia induced. Recommend medical therapy for cardiomyopathy. Consider TEE guided cardioversion tomorrow. I am going to start the patient on Eliquis 5 mg twice daily to be started this evening.  EKG:  EKG  is personally reviewed.  The ekg ordered today demonstrates NSR with LBBB, 1st degree AV block at 69 bpm.  Recent Labs: 08/18/2020: BUN 28; Creatinine, Ser 0.91; Potassium 4.9; Sodium 141  Recent Lipid Panel    Component Value Date/Time   CHOL 172 09/30/2019 0527   TRIG 83 09/30/2019 0527   HDL 42 09/30/2019 0527   CHOLHDL 4.1 09/30/2019 0527   VLDL 17 09/30/2019 0527   LDLCALC 113 (H) 09/30/2019 0527    Physical Exam:    VS:  BP 121/72   Pulse 69   Ht 5' 6"  (1.676 m)   Wt 201 lb 9.6 oz (91.4 kg)   SpO2 98%   BMI 32.54 kg/m     Wt Readings from Last 3 Encounters:  03/02/21 201 lb 9.6 oz (91.4 kg)  08/18/20 195 lb 9.6 oz (88.7 kg)  05/05/20 189 lb 6.4 oz (85.9 kg)    GEN: Well nourished, well developed in no acute distress HEENT: Normal, moist mucous membranes NECK: No JVD CARDIAC: regular rhythm, normal S1 and S2, no rubs or gallops. No murmur. VASCULAR: Radial and DP pulses 2+ bilaterally. No carotid bruits RESPIRATORY:  Clear to auscultation without rales, wheezing or rhonchi  ABDOMEN: Soft, non-tender, non-distended MUSCULOSKELETAL:  Ambulates independently SKIN: Warm and dry, trivial bilateral LE edema NEUROLOGIC:  Phelps and oriented x 3. No focal neuro deficits noted. PSYCHIATRIC:  Normal affect   ASSESSMENT:    1. Dilated cardiomyopathy ([place])   2. Chronic combined systolic and diastolic heart failure (HCC)   3. Paroxysmal atrial fibrillation (HCC)   4. Cardiac risk counseling   5. Counseling on health promotion and disease prevention    PLAN:    Nonischemic cardiomyopathy, with chronic systolic and diastolic heart failure:  -echo has been very slow to improve despite NSR. Initially thought to be tachycardia induced, may have another component of NICM -NYHA class I-II -continue carvedilol 12.5 mg BID -continue entresto 97-103 mg BID -EF above ICD level, but may need CRT in the future given LBBB. Reviewed again today. If she is on GDMT and develops NYHA  class 3 symptoms, would refer to EP. -rare lasix use -we have discussed SGLT2i. Jardiance is not on formulary, Wilder Glade not preferred. Recommended Invokana (canagliflozin) based on insurance, however only [place] and [place] have data for heart failure in patients without type II diabetes. Last A1c 6.1.  -she states that she will never take a statin as she feels this contributed to her father's worsening health. Last lipids 09/30/19 reviewed.  Hypertension: at goal -continue carvedilol -continue entresto  paroxysmal atrial fibrillation:  -continue apixaban 5 mg BID CHA2DS2/VAS Stroke Risk Points = 3  -has maintained SR since most recent cardioversion  Cardiac risk counseling and prevention recommendations: -recommend heart healthy/Mediterranean diet, with whole grains, fruits, vegetable, fish, lean meats, nuts, and olive oil. Limit salt. -recommend moderate walking, 3-5 times/week for 30-50 minutes each session. Aim for at least 150 minutes.week. Goal should be pace of 3 miles/hours, or walking 1.5 miles in 30 minutes -recommend avoidance of tobacco products. Avoid excess alcohol. -ASCVD risk score: The 10-year ASCVD risk score Mikey Bussing DC Jr., et al., 2013) is: 12.6%   Values used to calculate the score:  Age: 14 years     Sex: Female     Is Non-Hispanic African American: No     Diabetic: No     Tobacco smoker: No     Systolic Blood Pressure: 950 mmHg     Is BP treated: Yes     HDL Cholesterol: 42 mg/dL     Total Cholesterol: 172 mg/dL   -declines statin  Plan for follow up: 6 mos or sooner PRN  Buford Dresser, MD, PhD, [facility]   Medication Adjustments/Labs and Tests Ordered: Current medicines are reviewed at length with the patient today.  Concerns regarding medicines are outlined above.  Orders Placed This Encounter  Procedures  . EKG 12-Lead   Meds ordered this encounter  Medications  . furosemide (LASIX) 40 MG tablet    Sig: Take 1  tablet (40 mg total) by mouth daily as needed (weight gain of 3 lbs overnight or 5 lbs in one week).    Dispense:  30 tablet    Refill:  11    Patient when call when this needs filled.    Patient Instructions  Medication Instructions:  Your Physician recommend you continue on your current medication as directed.    *If you need a refill on your cardiac medications before your next appointment, please call your pharmacy*   Lab Work: None   Testing/Procedures: None   Follow-Up: At [facility], you and your health needs are our priority.  As part of our continuing mission to provide you with exceptional heart care, we have created designated Provider Care Teams.  These Care Teams include your primary Cardiologist (physician) and Advanced Practice Providers (APPs -  Physician Assistants and Nurse Practitioners) who all work together to provide you with the care you need, when you need it.  We recommend signing up for the patient portal called "MyChart".  Sign up information is provided on this After Visit Summary.  MyChart is used to connect with patients for Virtual Visits (Telemedicine).  Patients are able to view lab/test results, encounter notes, upcoming appointments, etc.  Non-urgent messages can be sent to your provider as well.   To learn more about what you can do with MyChart, go to [email or web address].    Your next appointment:   6 month(s)  The format for your next appointment:   In Person  Provider:   Buford Dresser, MD      Signed, Buford Dresser, MD PhD 03/02/2021    Evelyn Phelps

## 2021-03-02 NOTE — Patient Instructions (Signed)

## 2021-03-15 ENCOUNTER — Telehealth: Payer: Self-pay | Admitting: Cardiology

## 2021-03-15 NOTE — Telephone Encounter (Signed)
Spoke with patient and advised per office note (see below) she did not meet requirements    Asking for a handicap placard, discussed there are specific requirements for this and I cannot write for this for her  based on the requirements.   Patient appreciated call back and will work on getting PCP

## 2021-03-15 NOTE — Telephone Encounter (Signed)
Patient called and would like more information as to why the patient could not get approval for a Handicapped Placard from our office. The patient states she does not have a PCP at this time. She was also doing her research online and read that any licensed medical provider can assist in the application process.  Please advise

## 2021-04-21 ENCOUNTER — Other Ambulatory Visit: Payer: Self-pay | Admitting: Cardiology

## 2021-08-29 ENCOUNTER — Encounter (HOSPITAL_BASED_OUTPATIENT_CLINIC_OR_DEPARTMENT_OTHER): Payer: Self-pay | Admitting: Cardiology

## 2021-08-29 ENCOUNTER — Ambulatory Visit (HOSPITAL_BASED_OUTPATIENT_CLINIC_OR_DEPARTMENT_OTHER): Payer: Medicare PPO | Admitting: Cardiology

## 2021-08-29 ENCOUNTER — Other Ambulatory Visit: Payer: Self-pay

## 2021-08-29 VITALS — BP 130/74 | HR 83 | Ht 66.0 in | Wt 208.0 lb

## 2021-08-29 DIAGNOSIS — I480 Paroxysmal atrial fibrillation: Secondary | ICD-10-CM | POA: Diagnosis not present

## 2021-08-29 DIAGNOSIS — I420 Dilated cardiomyopathy: Secondary | ICD-10-CM | POA: Diagnosis not present

## 2021-08-29 DIAGNOSIS — Z7189 Other specified counseling: Secondary | ICD-10-CM

## 2021-08-29 DIAGNOSIS — I5042 Chronic combined systolic (congestive) and diastolic (congestive) heart failure: Secondary | ICD-10-CM

## 2021-08-29 NOTE — Progress Notes (Signed)
Cardiology Office Note:    Date:  08/29/2021   ID:  [name], DOB 03-19-1951, MRN [id number]  PCP:  Patient, No Pcp Per (Inactive)  Cardiologist:  Buford Dresser, MD  CC: follow up  History of Present Illness:    Evelyn Phelps is a 70 y.o. female with a hx of chronic systolic and diastolic heart failure, nonischemic cardiomyopathy, LBBB, paroxysmal atrial fibrillation who is seen in follow up. I met her during her hospitalization 09/2019.  Cardiac history: new diagnosis of acute combined systolic and diastolic heart failure, afib RVR during admission 09/2019.  She had TEE-CV during that admission. On TOC follow up was back in atrial fibrillation. S/P repeat cardioversion, maintaining sinus rhythm though EF remains reduced.  Today: Overall, she is doing well from a cardiovascular perspective. She denies any episodes of atrial fibrillation.  Since her last visit, her breathing has been "alright."  For activity, she does laundry, works in her yard, and cooks her own meals. She is also trying to exercise regularly and build up her stamina. Down to her laundry room she has 12 stairs, and she now tries to climb them 10 times a day. After a flight she typically is short of breath and will rest.  For her diet, she is trying to lose some weight. This year she has been struggling with eating too many sweets, but now she often has sugar-free Jell-O for dessert.  She denies any palpitations, or chest pain. No lightheadedness, headaches, syncope, orthopnea, or PND. Also has no lower extremity edema.   Past Medical History:  Diagnosis Date   Former tobacco use    Quit in 2018    Past Surgical History:  Procedure Laterality Date   CARDIOVERSION N/A 10/03/2019   Procedure: CARDIOVERSION;  Surgeon: Donato Heinz, MD;  Location[facility] ENDOSCOPY;  Service: Endoscopy;  Laterality: N/A;   CARDIOVERSION N/A 02/03/2020   Procedure: CARDIOVERSION;  Surgeon: Buford Dresser, MD;   Location: [facility] ENDOSCOPY;  Service: Cardiovascular;  Laterality: N/A;   LEFT HEART CATH AND CORONARY ANGIOGRAPHY N/A 10/01/2019   Procedure: LEFT HEART CATH AND CORONARY ANGIOGRAPHY;  Surgeon: Wellington Hampshire, MD;  Location: [place] CV LAB;  Service: Cardiovascular;  Laterality: N/A;   TEE WITHOUT CARDIOVERSION N/A 10/03/2019   Procedure: TRANSESOPHAGEAL ECHOCARDIOGRAM (TEE);  Surgeon: Donato Heinz, MD;  Location: [facility] ENDOSCOPY;  Service: Endoscopy;  Laterality: N/A;    Current Medications: Current Outpatient Medications on File Prior to Visit  Medication Sig   acetaminophen (TYLENOL) 500 MG tablet Take 500 mg by mouth every 6 (six) hours as needed (for pain.).    carvedilol (COREG) 12.5 MG tablet Take 1 tablet (12.5 mg total) by mouth 2 (two) times daily with a meal.   ELIQUIS 5 MG TABS tablet TAKE 1 TABLET BY MOUTH TWICE A DAY   ENTRESTO 97-103 MG TAKE 1 TABLET BY MOUTH 2 (TWO) TIMES DAILY.   furosemide (LASIX) 40 MG tablet Take 1 tablet (40 mg total) by mouth daily as needed (weight gain of 3 lbs overnight or 5 lbs in one week).   No current facility-administered medications on file prior to visit.     Allergies:   Erythromycin   Social History   Tobacco Use   Smoking status: Former    Types: Cigarettes    Quit date: 09/28/2017    Years since quitting: 3.9   Smokeless tobacco: Never  Substance Use Topics   Alcohol use: Not Currently    Family History: family  history includes Dementia in her father; High Cholesterol in her father; Thyroid disease in her mother.  ROS:   Please see the history of present illness.   (+) Shortness of breath Additional pertinent ROS negative except as noted in HPI.  EKGs/Labs/Other Studies Reviewed:    The following studies were reviewed today:  Echo 04/26/20 1. Left ventricular ejection fraction, by estimation, is 35-40%. The left ventricle has normal function. The left ventricle has no regional wall motion abnormalities.  There is akinesis of the left ventricular, basal-mid anteroseptal wall and inferoseptal wall. There is akinesis of the left ventricular, entire  inferior wall. The average left ventricular global longitudinal strain is abnormal at 14.1 %. The global longitudinal strain is normal.   2. Right ventricular systolic function is normal. The right ventricular size is normal. Tricuspid regurgitation signal is inadequate for assessing PA pressure.   3. The mitral valve is normal in structure. Mild mitral valve regurgitation. No evidence of mitral stenosis.   4. The aortic valve is tricuspid. Aortic valve regurgitation is not visualized. Mild aortic valve sclerosis is present, with no evidence of aortic valve stenosis.   5. The inferior vena cava is normal in size with greater than 50% respiratory variability, suggesting right atrial pressure of 3 mmHg.   6. Left atrial size was mildly dilated.   Echo 01/09/20  1. Left ventricular ejection fraction, by visual estimation, is 25 to 30%. The left ventricle has severely decreased function. There is mildly increased left ventricular hypertrophy.  2. Left ventricular diastolic function could not be evaluated.  3. Moderately dilated left ventricular internal cavity size.  4. The left ventricle demonstrates regional wall motion abnormalities.  5. Global right ventricle has normal systolic function.The right ventricular size is normal.  6. Left atrial size was severely dilated.  7. Right atrial size was normal.  8. Mild mitral annular calcification.  9. The mitral valve is normal in structure. Mild mitral valve regurgitation. No evidence of mitral stenosis. 10. The tricuspid valve is normal in structure. Tricuspid valve regurgitation is mild. 11. The aortic valve is tricuspid. Aortic valve regurgitation is not visualized. No evidence of aortic valve sclerosis or stenosis. 12. The pulmonic valve was normal in structure. Pulmonic valve regurgitation is trivial. 13.  The inferior vena cava is normal in size with greater than 50% respiratory variability, suggesting right atrial pressure of 3 mmHg. 14. Akinesis of the inferior wall; dyskinesis of the anteroseptal wall; overall severe LV dysfunction; mild LVH; moderate LVE; mild MR; severe LAE; mild TR.  Echo 09/29/19  1. Left ventricular ejection fraction, by visual estimation, is 25 to 30%. The left ventricle has severely decreased function. Severely increased left ventricular size. There is mildly increased left ventricular hypertrophy.  2. Left ventricular diastolic function could not be evaluated pattern of LV diastolic filling.  3. Global right ventricle has normal systolic function.The right ventricular size is normal.  4. Left atrial size was normal.  5. Right atrial size was normal.  6. Mild mitral annular calcification.  7. The mitral valve is abnormal. Mild mitral valve regurgitation. No evidence of mitral stenosis.  8. The tricuspid valve is normal in structure. Tricuspid valve regurgitation is mild.  9. The aortic valve is tricuspid Aortic valve regurgitation is trivial by color flow Doppler. Mild aortic valve sclerosis without stenosis. 10. The pulmonic valve was not well visualized. Pulmonic valve regurgitation is not visualized by color flow Doppler. 11. Mildly elevated pulmonary artery systolic pressure. 12. The inferior vena  cava is dilated in size with <50% respiratory variability, suggesting right atrial pressure of 15 mmHg. 13. Akinesis of the anteroseptal, apical and inferior walls; overall severely reduced LV systolic function; severe LVE; mild LVH; trace AI; mild MR and TR.   LHC 10/01/2019   Prox RCA lesion is 20% stenosed. There is severe left ventricular systolic dysfunction. LV end diastolic pressure is normal. The left ventricular ejection fraction is 25-35% by visual estimate.   1.  Mild stenosis in a small nondominant right coronary artery.  Otherwise normal coronary  arteries. 2.  Severely reduced LV systolic function. 3.  Normal left ventricular end-diastolic pressure.   Recommendations: The patient has nonischemic cardiomyopathy likely tachycardia induced. Recommend medical therapy for cardiomyopathy. Consider TEE guided cardioversion tomorrow.  I am going to start the patient on Eliquis 5 mg twice daily to be started this evening.  EKG:  EKG is personally reviewed.   08/29/2021: not done 03/02/2021: NSR with LBBB, 1st degree AV block at 69 bpm.  Recent Labs: No results found for requested labs within last 8760 hours.  Recent Lipid Panel    Component Value Date/Time   CHOL 172 09/30/2019 0527   TRIG 83 09/30/2019 0527   HDL 42 09/30/2019 0527   CHOLHDL 4.1 09/30/2019 0527   VLDL 17 09/30/2019 0527   LDLCALC 113 (H) 09/30/2019 0527    Physical Exam:    VS:  BP 130/74   Pulse 83   Ht 5' 6"  (1.676 m)   Wt 208 lb (94.3 kg)   SpO2 97%   BMI 33.57 kg/m     Wt Readings from Last 3 Encounters:  08/29/21 208 lb (94.3 kg)  03/02/21 201 lb 9.6 oz (91.4 kg)  08/18/20 195 lb 9.6 oz (88.7 kg)    GEN: Well nourished, well developed in no acute distress HEENT: Normal, moist mucous membranes NECK: No JVD CARDIAC: regular rhythm, normal S1 and S2, no rubs or gallops. No murmur. VASCULAR: Radial and DP pulses 2+ bilaterally. No carotid bruits RESPIRATORY:  Clear to auscultation without rales, wheezing or rhonchi  ABDOMEN: Soft, non-tender, non-distended MUSCULOSKELETAL:  Ambulates independently SKIN: Warm and dry, no edema NEUROLOGIC:  Alert and oriented x 3. No focal neuro deficits noted. PSYCHIATRIC:  Normal affect    ASSESSMENT:    1. Dilated cardiomyopathy ([place])   2. Chronic combined systolic and diastolic heart failure (HCC)   3. Paroxysmal atrial fibrillation (HCC)   4. Cardiac risk counseling   5. Counseling on health promotion and disease prevention     PLAN:    Nonischemic cardiomyopathy, with chronic systolic and diastolic  heart failure:  -echo has been very slow to improve despite NSR. Initially thought to be tachycardia induced, may have another component of NICM -NYHA class I-II -continue carvedilol 12.5 mg BID -continue entresto 97-103 mg BID -EF above ICD level, but may need CRT in the future given LBBB. Reviewed again today. If she is on GDMT and develops NYHA class 3 symptoms, would refer to EP. -rare lasix use -we have discussed SGLT2i. Jardiance is not on formulary, Wilder Glade not preferred. Recommended Invokana (canagliflozin) based on insurance, however only [place] and [place] have data for heart failure in patients without type II diabetes. Last A1c 6.1. Continue to assess based on insurance next visit -she states that she will never take a statin as she feels this contributed to her father's worsening health. Last lipids 09/30/19 reviewed.  Hypertension: at goal -continue carvedilol -continue entresto  paroxysmal atrial fibrillation:  -  continue apixaban 5 mg BID CHA2DS2/VAS Stroke Risk Points = 3  -has maintained SR since most recent cardioversion  Cardiac risk counseling and prevention recommendations: -recommend heart healthy/Mediterranean diet, with whole grains, fruits, vegetable, fish, lean meats, nuts, and olive oil. Limit salt. -recommend moderate walking, 3-5 times/week for 30-50 minutes each session. Aim for at least 150 minutes.week. Goal should be pace of 3 miles/hours, or walking 1.5 miles in 30 minutes -recommend avoidance of tobacco products. Avoid excess alcohol. -ASCVD risk score: The 10-year ASCVD risk score (Arnett DK, et al., 2019) is: 15.9%   Values used to calculate the score:     Age: 43 years     Sex: Female     Is Non-Hispanic African American: No     Diabetic: No     Tobacco smoker: No     Systolic Blood Pressure: 127 mmHg     Is BP treated: Yes     HDL Cholesterol: 42 mg/dL     Total Cholesterol: 172 mg/dL   -declines statin  Plan for follow up: 6 mos or  sooner PRN  Buford Dresser, MD, PhD, [facility]   Medication Adjustments/Labs and Tests Ordered: Current medicines are reviewed at length with the patient today.  Concerns regarding medicines are outlined above.  No orders of the defined types were placed in this encounter.  No orders of the defined types were placed in this encounter.   Patient Instructions  Medication Instructions:  Your physician recommends that you continue on your current medications as directed. Please refer to the Current Medication list given to you today.   *If you need a refill on your cardiac medications before your next appointment, please call your pharmacy*  Lab Work: NONE   Testing/Procedures: NONE  Follow-Up: At [facility], you and your health needs are our priority.  As part of our continuing mission to provide you with exceptional heart care, we have created designated Provider Care Teams.  These Care Teams include your primary Cardiologist (physician) and Advanced Practice Providers (APPs -  Physician Assistants and Nurse Practitioners) who all work together to provide you with the care you need, when you need it.  We recommend signing up for the patient portal called "MyChart".  Sign up information is provided on this After Visit Summary.  MyChart is used to connect with patients for Virtual Visits (Telemedicine).  Patients are able to view lab/test results, encounter notes, upcoming appointments, etc.  Non-urgent messages can be sent to your provider as well.   To learn more about what you can do with MyChart, go to [email or web address].    Your next appointment:   6 month(s)  The format for your next appointment:   In Person  Provider:   Buford Dresser, MD      [facility] Stumpf,acting as a scribe for Buford Dresser, MD.,have documented all relevant documentation on the behalf of Buford Dresser, MD,as directed by  Buford Dresser, MD while in the presence of Buford Dresser, MD.  I, Buford Dresser, MD, have reviewed all documentation for this visit. The documentation on 08/29/21 for the exam, diagnosis, procedures, and orders are all accurate and complete.   Signed, Buford Dresser, MD PhD 08/29/2021    [place]

## 2021-08-29 NOTE — Patient Instructions (Addendum)
Medication Instructions:  Your physician recommends that you continue on your current medications as directed. Please refer to the Current Medication list given to you today.  *If you need a refill on your cardiac medications before your next appointment, please call your pharmacy*  Lab Work: NONE  Testing/Procedures: NONE  Follow-Up: At [facility], you and your health needs are our priority.  As part of our continuing mission to provide you with exceptional heart care, we have created designated Provider Care Teams.  These Care Teams include your primary Cardiologist (physician) and Advanced Practice Providers (APPs -  Physician Assistants and Nurse Practitioners) who all work together to provide you with the care you need, when you need it.  We recommend signing up for the patient portal called "MyChart".  Sign up information is provided on this After Visit Summary.  MyChart is used to connect with patients for Virtual Visits (Telemedicine).  Patients are able to view lab/test results, encounter notes, upcoming appointments, etc.  Non-urgent messages can be sent to your provider as well.   To learn more about what you can do with MyChart, go to [email or web address].    Your next appointment:   6 month(s)  The format for your next appointment:   In Person  Provider:   Bridgette Christopher, MD     

## 2021-10-11 ENCOUNTER — Other Ambulatory Visit: Payer: Self-pay | Admitting: Cardiology

## 2021-10-11 DIAGNOSIS — I4819 Other persistent atrial fibrillation: Secondary | ICD-10-CM

## 2021-10-11 NOTE — Telephone Encounter (Signed)
Labs needed routing to pharmd pool for orders.  Prescription refill request for Eliquis received. Indication:afib Last office visit:christopher 08/29/21 Scr:0.91 08/18/20 Age: 57f Weight:94.3kg

## 2021-10-11 NOTE — Telephone Encounter (Signed)
Please order BMET and CBC for annual DOAC monitoring. Ok to send in 1 month refill if needed, pt on correct dose based on age and weight

## 2021-10-12 ENCOUNTER — Other Ambulatory Visit: Payer: Self-pay

## 2021-10-12 DIAGNOSIS — I4819 Other persistent atrial fibrillation: Secondary | ICD-10-CM

## 2021-10-12 NOTE — Telephone Encounter (Signed)
Eliquis refill Requested received via fax. Medication refill yesterday (10/11/21).

## 2021-10-13 ENCOUNTER — Other Ambulatory Visit (HOSPITAL_BASED_OUTPATIENT_CLINIC_OR_DEPARTMENT_OTHER): Payer: Self-pay | Admitting: Cardiology

## 2021-10-13 DIAGNOSIS — I4819 Other persistent atrial fibrillation: Secondary | ICD-10-CM

## 2021-10-13 NOTE — Telephone Encounter (Signed)
Overdue labs routing to pharmd pool for orders.  Prescription refill request for Eliquis received. Indication:afib Last office visit:christopher 08/29/21 Scr: 0.910 mg/ 08/18/2020 Age: 27f Weight:94.3kg

## 2021-10-13 NOTE — Telephone Encounter (Signed)
Received fax from [facility] on [address] In [place] requesting New Rx for Eliquis 5 mg (90 day supply).

## 2021-10-17 NOTE — Telephone Encounter (Signed)
Called and spoke to pt and stated that they needed lab work asap and they voiced understanding

## 2021-10-17 NOTE — Telephone Encounter (Signed)
You already placed orders on 10/25, please call pt to remind her to come in for labs

## 2021-10-18 LAB — BASIC METABOLIC PANEL
BUN/Creatinine Ratio: 24 (ref 12–28)
BUN: 19 mg/dL (ref 8–27)
CO2: 24 mmol/L (ref 20–29)
Calcium: 8.8 mg/dL (ref 8.7–10.3)
Chloride: 103 mmol/L (ref 96–106)
Creatinine, Ser: 0.79 mg/dL (ref 0.57–1.00)
Glucose: 103 mg/dL — ABNORMAL HIGH (ref 70–99)
Potassium: 4.4 mmol/L (ref 3.5–5.2)
Sodium: 140 mmol/L (ref 134–144)
eGFR: 79 mL/min/{1.73_m2} (ref >59)

## 2021-10-18 LAB — CBC
Hematocrit: 38.5 % (ref 34.0–46.6)
Hemoglobin: 12.9 g/dL (ref 11.1–15.9)
MCH: 31.4 pg (ref 26.6–33.0)
MCHC: 33.5 g/dL (ref 31.5–35.7)
MCV: 94 fL (ref 79–97)
Platelets: 204 10*3/uL (ref 150–450)
RBC: 4.11 x10E6/uL (ref 3.77–5.28)
RDW: 11.7 % (ref 11.7–15.4)
WBC: 5.6 10*3/uL (ref 3.4–10.8)

## 2021-10-21 ENCOUNTER — Telehealth: Payer: Self-pay | Admitting: Cardiology

## 2021-10-21 ENCOUNTER — Other Ambulatory Visit: Payer: Self-pay

## 2021-10-21 DIAGNOSIS — I4819 Other persistent atrial fibrillation: Secondary | ICD-10-CM

## 2021-10-21 MED ORDER — APIXABAN 5 MG PO TABS: 5.0000 mg | ORAL_TABLET | Freq: Two times a day (BID) | ORAL | 0 refills | Status: DC

## 2021-10-21 NOTE — Telephone Encounter (Signed)
*  STAT* If patient is at the pharmacy, call can be transferred to refill team.   1. Which medications need to be refilled? (please list name of each medication and dose if known)  apixaban (ELIQUIS) 5 MG TABS tablet  2. Which pharmacy/location (including street and city if local pharmacy) is medication to be sent to? [id number] [place] [place] - [address] [facility] [address]  3. Do they need a 30 day or 90 day supply?   90 day supply  Patient is requesting to have  her old Rx increased to a 90 day supply. She states it is less expensive for 90 days.

## 2021-10-21 NOTE — Telephone Encounter (Signed)
Prescription refill request for Eliquis received. Indication:Afib Last office visit:9/22 Scr:0.7 Age: 70 Weight:94.3 kg  Prescription refilled

## 2022-01-17 ENCOUNTER — Other Ambulatory Visit: Payer: Self-pay | Admitting: Cardiology

## 2022-01-17 DIAGNOSIS — I4819 Other persistent atrial fibrillation: Secondary | ICD-10-CM

## 2022-01-17 NOTE — Telephone Encounter (Signed)
Prescription refill request for Eliquis received. Indication: afib  Last office visit:Evelyn Phelps, 08/29/2021 Scr: 0.79, 10/17/2021 Age: 71 yo  Weight: 94.3 kg  Refill sent.

## 2022-03-07 ENCOUNTER — Other Ambulatory Visit: Payer: Self-pay

## 2022-03-07 ENCOUNTER — Encounter (HOSPITAL_BASED_OUTPATIENT_CLINIC_OR_DEPARTMENT_OTHER): Payer: Self-pay | Admitting: Cardiology

## 2022-03-07 ENCOUNTER — Ambulatory Visit (HOSPITAL_BASED_OUTPATIENT_CLINIC_OR_DEPARTMENT_OTHER): Payer: Medicare PPO | Admitting: Cardiology

## 2022-03-07 VITALS — BP 138/78 | HR 78 | Ht 67.0 in | Wt 220.0 lb

## 2022-03-07 DIAGNOSIS — I420 Dilated cardiomyopathy: Secondary | ICD-10-CM | POA: Diagnosis not present

## 2022-03-07 DIAGNOSIS — I10 Essential (primary) hypertension: Secondary | ICD-10-CM

## 2022-03-07 DIAGNOSIS — I5042 Chronic combined systolic (congestive) and diastolic (congestive) heart failure: Secondary | ICD-10-CM | POA: Diagnosis not present

## 2022-03-07 DIAGNOSIS — I480 Paroxysmal atrial fibrillation: Secondary | ICD-10-CM | POA: Diagnosis not present

## 2022-03-07 DIAGNOSIS — Z7189 Other specified counseling: Secondary | ICD-10-CM

## 2022-03-07 MED ORDER — ENTRESTO 97-103 MG PO TABS: 1.0000 | ORAL_TABLET | Freq: Two times a day (BID) | ORAL | 3 refills | Status: DC

## 2022-03-07 NOTE — Patient Instructions (Signed)

## 2022-03-07 NOTE — Progress Notes (Signed)
?Cardiology Office Note:   ? ?Date:  03/07/2022  ? ?ID:  Evelyn Phelps, DOB [birth year], MRN [id number] ? ?PCP:  Patient, No Pcp Per (Inactive)  ?Cardiologist:  Buford Dresser, MD ? ?CC: follow up ? ?History of Present Illness:   ? ?Evelyn Phelps is a 71 y.o. female with a hx of chronic systolic and diastolic heart failure, nonischemic cardiomyopathy, LBBB, paroxysmal atrial fibrillation who is seen in follow up. I met her during her hospitalization 09/2019. ? ?Cardiac history: new diagnosis of acute combined systolic and diastolic heart failure, afib RVR during admission 09/2019.  She had TEE-CV during that admission. On TOC follow up was back in atrial fibrillation. S/P repeat cardioversion, maintaining sinus rhythm though EF remains reduced. ? ?Today: ?Overall, she is feeling okay. Her shortness of breath is still present, but she states that "it doesn't last." She denies any recurrent pains. ? ?Lately she has been taking her Lasix slightly more often, maybe every 2-3 weeks. At one time she tried cutting this in half, but she did not notice enough improvement in her symptoms.  ? ?For exercise she climbs her 12 stairs about 10 times a day. She has been doing this consistently since her last visit. She plans to walk outdoors once the warmer weather returns. ? ?Recently she seems to be eating later in the day. She continues to work on dietary changes, including cutting back on ice cream and other sweets. ? ?She denies any palpitations, chest pain, or peripheral edema. No lightheadedness, headaches, syncope, orthopnea, or PND. ? ? ?Past Medical History:  ?Diagnosis Date  ? Former tobacco use   ? Quit in 2018  ? ? ?Past Surgical History:  ?Procedure Laterality Date  ? CARDIOVERSION N/A 10/03/2019  ? Procedure: CARDIOVERSION;  Surgeon: Donato Heinz, MD;  Location: [facility] ENDOSCOPY;  Service: Endoscopy;  Laterality: N/A;  ? CARDIOVERSION N/A 02/03/2020  ? Procedure: CARDIOVERSION;  Surgeon: Buford Dresser, MD;  Location: [facility] ENDOSCOPY;  Service: Cardiovascular;  Laterality: N/A;  ? LEFT HEART CATH AND CORONARY ANGIOGRAPHY N/A 10/01/2019  ? Procedure: LEFT HEART CATH AND CORONARY ANGIOGRAPHY;  Surgeon: Wellington Hampshire, MD;  Location: [place] [facility] CV LAB;  Service: Cardiovascular;  Laterality: N/A;  ? TEE WITHOUT CARDIOVERSION N/A 10/03/2019  ? Procedure: TRANSESOPHAGEAL ECHOCARDIOGRAM (TEE);  Surgeon: Donato Heinz, MD;  Location: [facility] ENDOSCOPY;  Service: Endoscopy;  Laterality: N/A;  ? ? ?Current Medications: ?Current Outpatient Medications on File Prior to Visit  ?Medication Sig  ? acetaminophen (TYLENOL) 500 MG tablet Take 500 mg by mouth every 6 (six) hours as needed (for pain.).   ? apixaban (ELIQUIS) 5 MG TABS tablet TAKE 1 TABLET BY MOUTH TWICE A DAY  ? carvedilol (COREG) 12.5 MG tablet TAKE 1 TABLET BY MOUTH TWICE A DAY WITH A MEAL  ? ENTRESTO 97-103 MG TAKE 1 TABLET BY MOUTH TWICE A DAY  ? furosemide (LASIX) 40 MG tablet Take 1 tablet (40 mg total) by mouth daily as needed (weight gain of 3 lbs overnight or 5 lbs in one week).  ? ?No current facility-administered medications on file prior to visit.  ?  ? ?Allergies:   Erythromycin  ? ?Social History  ? ?Tobacco Use  ? Smoking status: Former  ?  Types: Cigarettes  ?  Quit date: 09/28/2017  ?  Years since quitting: 4.4  ? Smokeless tobacco: Never  ?Substance Use Topics  ? Alcohol use: Not Currently  ? ? ?Family History: ?family history includes Dementia in her  father; High Cholesterol in her father; Thyroid disease in her mother. ? ?ROS:   ?Please see the history of present illness.   ?(+) Shortness of breath ?Additional pertinent ROS negative except as noted in HPI. ? ?EKGs/Labs/Other Studies Reviewed:   ? ?The following studies were reviewed today: ? ?Echo 04/26/20 ?1. Left ventricular ejection fraction, by estimation, is 35-40%. The left ventricle has normal function. The left ventricle has no regional wall motion abnormalities. There is  akinesis of the left ventricular, basal-mid anteroseptal wall and inferoseptal wall. There is akinesis of the left ventricular, entire  ?inferior wall. The average left ventricular global longitudinal strain is abnormal at 14.1 %. The global longitudinal strain is normal.  ? 2. Right ventricular systolic function is normal. The right ventricular size is normal. Tricuspid regurgitation signal is inadequate for assessing PA pressure.  ? 3. The mitral valve is normal in structure. Mild mitral valve regurgitation. No evidence of mitral stenosis.  ? 4. The aortic valve is tricuspid. Aortic valve regurgitation is not visualized. Mild aortic valve sclerosis is present, with no evidence of aortic valve stenosis.  ? 5. The inferior vena cava is normal in size with greater than 50% respiratory variability, suggesting right atrial pressure of 3 mmHg.  ? 6. Left atrial size was mildly dilated.  ? ?Echo 01/09/20 ? 1. Left ventricular ejection fraction, by visual estimation, is 25 to 30%. The left ventricle has severely decreased function. There is mildly increased left ventricular hypertrophy. ? 2. Left ventricular diastolic function could not be evaluated. ? 3. Moderately dilated left ventricular internal cavity size. ? 4. The left ventricle demonstrates regional wall motion abnormalities. ? 5. Global right ventricle has normal systolic function.The right ventricular size is normal. ? 6. Left atrial size was severely dilated. ? 7. Right atrial size was normal. ? 8. Mild mitral annular calcification. ? 9. The mitral valve is normal in structure. Mild mitral valve regurgitation. No evidence of mitral stenosis. ?10. The tricuspid valve is normal in structure. Tricuspid valve regurgitation is mild. ?11. The aortic valve is tricuspid. Aortic valve regurgitation is not visualized. No evidence of aortic valve sclerosis or stenosis. ?12. The pulmonic valve was normal in structure. Pulmonic valve regurgitation is trivial. ?13. The  inferior vena cava is normal in size with greater than 50% respiratory variability, suggesting right atrial pressure of 3 mmHg. ?14. Akinesis of the inferior wall; dyskinesis of the anteroseptal wall; overall severe LV dysfunction; mild LVH; moderate LVE; mild MR; severe LAE; mild TR. ? ?Echo 09/29/19 ? 1. Left ventricular ejection fraction, by visual estimation, is 25 to 30%. The left ventricle has severely decreased function. Severely increased left ventricular size. There is mildly increased left ventricular hypertrophy. ? 2. Left ventricular diastolic function could not be evaluated pattern of LV diastolic filling. ? 3. Global right ventricle has normal systolic function.The right ventricular size is normal. ? 4. Left atrial size was normal. ? 5. Right atrial size was normal. ? 6. Mild mitral annular calcification. ? 7. The mitral valve is abnormal. Mild mitral valve regurgitation. No evidence of mitral stenosis. ? 8. The tricuspid valve is normal in structure. Tricuspid valve regurgitation is mild. ? 9. The aortic valve is tricuspid Aortic valve regurgitation is trivial by color flow Doppler. Mild aortic valve sclerosis without stenosis. ?10. The pulmonic valve was not well visualized. Pulmonic valve regurgitation is not visualized by color flow Doppler. ?11. Mildly elevated pulmonary artery systolic pressure. ?12. The inferior vena cava is dilated in size  with <50% respiratory variability, suggesting right atrial pressure of 15 mmHg. ?13. Akinesis of the anteroseptal, apical and inferior walls; overall severely reduced LV systolic function; severe LVE; mild LVH; trace AI; mild MR and TR. ?  ?[place] 10/01/2019 ?  ?Prox RCA lesion is 20% stenosed. ?There is severe left ventricular systolic dysfunction. ?LV end diastolic pressure is normal. ?The left ventricular ejection fraction is 25-35% by visual estimate. ?  ?1.  Mild stenosis in a small nondominant right coronary artery.  Otherwise normal coronary arteries. ?2.   Severely reduced LV systolic function. ?3.  Normal left ventricular end-diastolic pressure. ?  ?Recommendations: ?The patient has nonischemic cardiomyopathy likely tachycardia induced. ?Recommend medical therapy fo

## 2022-05-20 ENCOUNTER — Other Ambulatory Visit: Payer: Self-pay | Admitting: Cardiology

## 2022-05-20 DIAGNOSIS — I4819 Other persistent atrial fibrillation: Secondary | ICD-10-CM

## 2022-05-22 NOTE — Telephone Encounter (Signed)
Please review for refill. Thank you! 

## 2022-05-23 NOTE — Telephone Encounter (Signed)
Prescription refill request for Eliquis received. Indication: a fib Last office visit: 03/07/22 Scr: 0.79 Age: 71 Weight:  99kg

## 2022-09-29 ENCOUNTER — Other Ambulatory Visit: Payer: Self-pay | Admitting: Cardiology

## 2022-09-29 NOTE — Telephone Encounter (Signed)
Rx(s) sent to pharmacy electronically.  

## 2022-10-02 ENCOUNTER — Other Ambulatory Visit: Payer: Self-pay | Admitting: Cardiology

## 2022-10-02 DIAGNOSIS — I420 Dilated cardiomyopathy: Secondary | ICD-10-CM

## 2022-10-02 NOTE — Telephone Encounter (Signed)
Rx(s) sent to pharmacy electronically.  

## 2022-12-31 ENCOUNTER — Other Ambulatory Visit: Payer: Self-pay | Admitting: Cardiology

## 2022-12-31 DIAGNOSIS — I4819 Other persistent atrial fibrillation: Secondary | ICD-10-CM

## 2023-01-01 NOTE — Telephone Encounter (Signed)
Please review for refill. Thank you! 

## 2023-01-01 NOTE — Telephone Encounter (Addendum)
Prescription refill request for Eliquis received. Indication: Afib  Last office visit: 03/07/22 Harrell Gave)  Scr: 0.79 (10/17/21)  Age: 72 Weight: 99.8kg  Labs overdue. Called pt and made her aware. Labs ordered. Pt stated she could come to [facility] office lab tomorrow, 01/02/23. Refill sent to prevent any missed doses.

## 2023-01-02 LAB — CBC
Hematocrit: 39.7 % (ref 34.0–46.6)
Hemoglobin: 13.3 g/dL (ref 11.1–15.9)
MCH: 31.3 pg (ref 26.6–33.0)
MCHC: 33.5 g/dL (ref 31.5–35.7)
MCV: 93 fL (ref 79–97)
Platelets: 196 10*3/uL (ref 150–450)
RBC: 4.25 x10E6/uL (ref 3.77–5.28)
RDW: 11.9 % (ref 11.7–15.4)
WBC: 5 10*3/uL (ref 3.4–10.8)

## 2023-01-03 LAB — BASIC METABOLIC PANEL
BUN/Creatinine Ratio: 28 (ref 12–28)
BUN: 23 mg/dL (ref 8–27)
CO2: 24 mmol/L (ref 20–29)
Calcium: 8.6 mg/dL — ABNORMAL LOW (ref 8.7–10.3)
Chloride: 102 mmol/L (ref 96–106)
Creatinine, Ser: 0.81 mg/dL (ref 0.57–1.00)
Glucose: 112 mg/dL — ABNORMAL HIGH (ref 70–99)
Potassium: 4.3 mmol/L (ref 3.5–5.2)
Sodium: 139 mmol/L (ref 134–144)
eGFR: 77 mL/min/{1.73_m2} (ref >59)

## 2023-01-04 NOTE — Progress Notes (Signed)
Labs stable for refills

## 2023-03-28 ENCOUNTER — Other Ambulatory Visit: Payer: Self-pay | Admitting: Cardiology

## 2023-03-28 DIAGNOSIS — I420 Dilated cardiomyopathy: Secondary | ICD-10-CM

## 2023-03-28 DIAGNOSIS — I5042 Chronic combined systolic (congestive) and diastolic (congestive) heart failure: Secondary | ICD-10-CM

## 2023-03-28 NOTE — Telephone Encounter (Signed)
Please call pt to schedule overdue follow-up appointment with Dr. Christopher or APP for refills. Thank you! 

## 2023-03-28 NOTE — Telephone Encounter (Signed)
Rx request sent to pharmacy.  

## 2023-03-28 NOTE — Telephone Encounter (Signed)
Scheduled 03/30/23 with Dr. Cristal Deer

## 2023-03-30 ENCOUNTER — Ambulatory Visit (HOSPITAL_BASED_OUTPATIENT_CLINIC_OR_DEPARTMENT_OTHER): Payer: Medicare PPO | Admitting: Cardiology

## 2023-03-30 ENCOUNTER — Encounter (HOSPITAL_BASED_OUTPATIENT_CLINIC_OR_DEPARTMENT_OTHER): Payer: Self-pay | Admitting: Cardiology

## 2023-03-30 VITALS — BP 128/76 | HR 66 | Ht 67.0 in | Wt 222.5 lb

## 2023-03-30 DIAGNOSIS — I480 Paroxysmal atrial fibrillation: Secondary | ICD-10-CM | POA: Diagnosis not present

## 2023-03-30 DIAGNOSIS — I10 Essential (primary) hypertension: Secondary | ICD-10-CM

## 2023-03-30 DIAGNOSIS — I5042 Chronic combined systolic (congestive) and diastolic (congestive) heart failure: Secondary | ICD-10-CM

## 2023-03-30 DIAGNOSIS — I420 Dilated cardiomyopathy: Secondary | ICD-10-CM

## 2023-03-30 DIAGNOSIS — Z7189 Other specified counseling: Secondary | ICD-10-CM

## 2023-03-30 DIAGNOSIS — I4819 Other persistent atrial fibrillation: Secondary | ICD-10-CM

## 2023-03-30 MED ORDER — FUROSEMIDE 40 MG PO TABS
40.0000 mg | ORAL_TABLET | ORAL | 1 refills | Status: DC | PRN
Start: 2023-03-30 — End: 2024-07-01

## 2023-03-30 MED ORDER — EMPAGLIFLOZIN 10 MG PO TABS
10.0000 mg | ORAL_TABLET | Freq: Every day | ORAL | 11 refills | Status: DC
Start: 2023-03-30 — End: 2024-07-01

## 2023-03-30 MED ORDER — APIXABAN 5 MG PO TABS
5.0000 mg | ORAL_TABLET | Freq: Two times a day (BID) | ORAL | 1 refills | Status: AC
Start: 2023-03-30 — End: ?

## 2023-03-30 MED ORDER — EMPAGLIFLOZIN 10 MG PO TABS: 10.0000 mg | ORAL_TABLET | Freq: Every day | ORAL | 0 refills | Status: DC

## 2023-03-30 MED ORDER — CARVEDILOL 12.5 MG PO TABS
12.5000 mg | ORAL_TABLET | Freq: Two times a day (BID) | ORAL | 3 refills | Status: DC
Start: 2023-03-30 — End: 2024-03-17

## 2023-03-30 NOTE — Patient Instructions (Signed)
Medication Instructions:  Jardiance 10 mg daily   *If you need a refill on your cardiac medications before your next appointment, please call your pharmacy*   Lab Work:  BMET in 2 weeks.    Testing/Procedures: N/A   Follow-Up: At [facility], you and your health needs are our priority.  As part of our continuing mission to provide you with exceptional heart care, we have created designated Provider Care Teams.  These Care Teams include your primary Cardiologist (physician) and Advanced Practice Providers (APPs -  Physician Assistants and Nurse Practitioners) who all work together to provide you with the care you need, when you need it.  We recommend signing up for the patient portal called "MyChart".  Sign up information is provided on this After Visit Summary.  MyChart is used to connect with patients for Virtual Visits (Telemedicine).  Patients are able to view lab/test results, encounter notes, upcoming appointments, etc.  Non-urgent messages can be sent to your provider as well.   To learn more about what you can do with MyChart, go to [email or web address].    Your next appointment:   1 year  Provider:   Jodelle Red, MD    Other Instructions Pt. Assistance forms for your completion

## 2023-03-30 NOTE — Progress Notes (Signed)
Cardiology Office Note:    Date:  03/30/2023   ID:  Evelyn Phelps, DOB July 29, 1951, MRN [id number]  PCP:  Patient, No Pcp Per  Cardiologist:  Jodelle Red, MD  CC: follow up  History of Present Illness:    Evelyn Phelps is a 72 y.o. Phelps with a hx of chronic systolic and diastolic heart failure, nonischemic cardiomyopathy, LBBB, paroxysmal atrial fibrillation who is seen in follow up. I met her during her hospitalization 09/2019.  Cardiac history: new diagnosis of acute combined systolic and diastolic heart failure, afib RVR during admission 09/2019.  She had TEE-CV during that admission. On TOC follow up was back in atrial fibrillation. S/P repeat cardioversion, maintaining sinus rhythm though EF remains reduced.  At her last visit, she noted her shortness of breath didn't last long when it occurred. She was taking her Lasix maybe every 2-3 weeks. At one time she tried 1/2 of her Lasix tablet, but did not see enough improvement in her symptoms. She was consistently exercising by climbing her 12 stairs about 10 times a day. She continued to work on dietary changes. We discussed SGLT2i. Patient preferred not to add additional medication at that time. She stated that she will never take a statin as she feels this contributed to her father's worsening health. Last lipids 09/30/19 reviewed.  Today, her blood pressure is stable in clinic at 128/76 despite recent life stressors. She denies being aware of any arrhythmic episodes.   She believes she has been retaining more fluid. This week she has taken Lasix 3 times. Unfortunately this also limits her activity due to urinary frequency. In the mornings her LE swelling does improve.  Frequently she does feel short winded. However, she states that she has largely adapted to this. Her dyspnea on exertion is the worst in situations like parking far away from the grocery store entrance. She does well with support such as using the shopping cart.  Knowing that she will be winded with long distances prevents her from completing some activities she would enjoy. Lately her most intense physical activity has been climbing stairs. She uses the stairs to the basement to do laundry.  She is tolerating her medications. We resumed discussion of SGLT2i at length.  She denies any chest pain, lightheadedness, headaches, syncope, orthopnea, or PND.   Past Medical History:  Diagnosis Date   Former tobacco use    Quit in 2018    Past Surgical History:  Procedure Laterality Date   CARDIOVERSION N/A 10/03/2019   Procedure: CARDIOVERSION;  Surgeon: Little Ishikawa, MD;  Location: [facility] ENDOSCOPY;  Service: Endoscopy;  Laterality: N/A;   CARDIOVERSION N/A 02/03/2020   Procedure: CARDIOVERSION;  Surgeon: Jodelle Red, MD;  Location: [facility] ENDOSCOPY;  Service: Cardiovascular;  Laterality: N/A;   LEFT HEART CATH AND CORONARY ANGIOGRAPHY N/A 10/01/2019   Procedure: LEFT HEART CATH AND CORONARY ANGIOGRAPHY;  Surgeon: Iran Ouch, MD;  Location: [facility] INVASIVE CV LAB;  Service: Cardiovascular;  Laterality: N/A;   TEE WITHOUT CARDIOVERSION N/A 10/03/2019   Procedure: TRANSESOPHAGEAL ECHOCARDIOGRAM (TEE);  Surgeon: Little Ishikawa, MD;  Location: [facility] ENDOSCOPY;  Service: Endoscopy;  Laterality: N/A;    Current Medications: Current Outpatient Medications on File Prior to Visit  Medication Sig   acetaminophen (TYLENOL) 500 MG tablet Take 500 mg by mouth every 6 (six) hours as needed (for pain.).    carvedilol (COREG) 12.5 MG tablet TAKE 1 TABLET BY MOUTH TWICE A DAY WITH MEALS (Patient taking differently: Take 12.5  mg by mouth 2 (two) times daily with a meal.)   ELIQUIS 5 MG TABS tablet TAKE 1 TABLET BY MOUTH TWICE A DAY   furosemide (LASIX) 40 MG tablet TAKE 1 TABLET BY MOUTH DAILY AS NEEDED (WEIGHT GAIN OF 3 LBS OVERNIGHT OR 5 LBS IN ONE WEEK).   sacubitril-valsartan (ENTRESTO) 97-103 MG Take 1 tablet by mouth 2 (two) times daily.  Please keep your upcoming appointment for refills.   No current facility-administered medications on file prior to visit.     Allergies:   Erythromycin   Social History   Tobacco Use   Smoking status: Former    Types: Cigarettes    Quit date: 09/28/2017    Years since quitting: 5.5   Smokeless tobacco: Never  Substance Use Topics   Alcohol use: Not Currently    Family History: family history includes Dementia in her father; High Cholesterol in her father; Thyroid disease in her mother.  ROS:   Please see the history of present illness.   (+)  Fluid retention (+)  Exertional shortness of breath (+)  Urinary frequency on Lasix Additional pertinent ROS negative except as noted in HPI.  EKGs/Labs/Other Studies Reviewed:    The following studies were reviewed today:  Echo 04/26/20 1. Left ventricular ejection fraction, by estimation, is 35-40%. The left ventricle has normal function. The left ventricle has no regional wall motion abnormalities. There is akinesis of the left ventricular, basal-mid anteroseptal wall and inferoseptal wall. There is akinesis of the left ventricular, entire  inferior wall. The average left ventricular global longitudinal strain is abnormal at 14.1 %. The global longitudinal strain is normal.   2. Right ventricular systolic function is normal. The right ventricular size is normal. Tricuspid regurgitation signal is inadequate for assessing PA pressure.   3. The mitral valve is normal in structure. Mild mitral valve regurgitation. No evidence of mitral stenosis.   4. The aortic valve is tricuspid. Aortic valve regurgitation is not visualized. Mild aortic valve sclerosis is present, with no evidence of aortic valve stenosis.   5. The inferior vena cava is normal in size with greater than 50% respiratory variability, suggesting right atrial pressure of 3 mmHg.   6. Left atrial size was mildly dilated.   Echo 01/09/20  1. Left ventricular ejection fraction,  by visual estimation, is 25 to 30%. The left ventricle has severely decreased function. There is mildly increased left ventricular hypertrophy.  2. Left ventricular diastolic function could not be evaluated.  3. Moderately dilated left ventricular internal cavity size.  4. The left ventricle demonstrates regional wall motion abnormalities.  5. Global right ventricle has normal systolic function.The right ventricular size is normal.  6. Left atrial size was severely dilated.  7. Right atrial size was normal.  8. Mild mitral annular calcification.  9. The mitral valve is normal in structure. Mild mitral valve regurgitation. No evidence of mitral stenosis. 10. The tricuspid valve is normal in structure. Tricuspid valve regurgitation is mild. 11. The aortic valve is tricuspid. Aortic valve regurgitation is not visualized. No evidence of aortic valve sclerosis or stenosis. 12. The pulmonic valve was normal in structure. Pulmonic valve regurgitation is trivial. 13. The inferior vena cava is normal in size with greater than 50% respiratory variability, suggesting right atrial pressure of 3 mmHg. 14. Akinesis of the inferior wall; dyskinesis of the anteroseptal wall; overall severe LV dysfunction; mild LVH; moderate LVE; mild MR; severe LAE; mild TR.  Echo 09/29/19  1. Left ventricular ejection  fraction, by visual estimation, is 25 to 30%. The left ventricle has severely decreased function. Severely increased left ventricular size. There is mildly increased left ventricular hypertrophy.  2. Left ventricular diastolic function could not be evaluated pattern of LV diastolic filling.  3. Global right ventricle has normal systolic function.The right ventricular size is normal.  4. Left atrial size was normal.  5. Right atrial size was normal.  6. Mild mitral annular calcification.  7. The mitral valve is abnormal. Mild mitral valve regurgitation. No evidence of mitral stenosis.  8. The tricuspid valve is  normal in structure. Tricuspid valve regurgitation is mild.  9. The aortic valve is tricuspid Aortic valve regurgitation is trivial by color flow Doppler. Mild aortic valve sclerosis without stenosis. 10. The pulmonic valve was not well visualized. Pulmonic valve regurgitation is not visualized by color flow Doppler. 11. Mildly elevated pulmonary artery systolic pressure. 12. The inferior vena cava is dilated in size with <50% respiratory variability, suggesting right atrial pressure of 15 mmHg. 13. Akinesis of the anteroseptal, apical and inferior walls; overall severely reduced LV systolic function; severe LVE; mild LVH; trace AI; mild MR and TR.   LHC 10/01/2019 Prox RCA lesion is 20% stenosed. There is severe left ventricular systolic dysfunction. LV end diastolic pressure is normal. The left ventricular ejection fraction is 25-35% by visual estimate.   1.  Mild stenosis in a small nondominant right coronary artery.  Otherwise normal coronary arteries. 2.  Severely reduced LV systolic function. 3.  Normal left ventricular end-diastolic pressure.   Recommendations: The patient has nonischemic cardiomyopathy likely tachycardia induced. Recommend medical therapy for cardiomyopathy. Consider TEE guided cardioversion tomorrow.  I am going to start the patient on Eliquis 5 mg twice daily to be started this evening.  EKG:  EKG is personally reviewed.   03/30/2023:  SR with 1st degree AV block, LBBB at 66 bpm, PVC. QRS 170 ms 03/07/2022: SR, 1st degree AV block, LBBB at 78 bpm 08/29/2021: not done 03/02/2021: NSR with LBBB, 1st degree AV block at 69 bpm.  Recent Labs: 01/02/2023: BUN 23; Creatinine, Ser 0.81; Hemoglobin 13.3; Platelets 196; Potassium 4.3; Sodium 139   Recent Lipid Panel    Component Value Date/Time   CHOL 172 09/30/2019 0527   TRIG 83 09/30/2019 0527   HDL 42 09/30/2019 0527   CHOLHDL 4.1 09/30/2019 0527   VLDL 17 09/30/2019 0527   LDLCALC 113 (H) 09/30/2019 0527     Physical Exam:    VS:  BP 128/76 (BP Location: Left Arm, Patient Position: Sitting, Cuff Size: Large)   Pulse 66   Ht 5\' 7"  (1.702 m)   Wt 222 lb 8 oz (100.9 kg)   BMI 34.85 kg/m     Wt Readings from Last 3 Encounters:  03/30/23 222 lb 8 oz (100.9 kg)  03/07/22 220 lb (99.8 kg)  08/29/21 208 lb (94.3 kg)    GEN: Well nourished, well developed in no acute distress HEENT: Normal, moist mucous membranes NECK: No JVD CARDIAC: regular rhythm, normal S1 and S2, no rubs or gallops. No murmur. VASCULAR: Radial and DP pulses 2+ bilaterally. No carotid bruits RESPIRATORY:  Clear to auscultation without rales, wheezing or rhonchi  ABDOMEN: Soft, non-tender, non-distended MUSCULOSKELETAL:  Ambulates independently SKIN: Warm and dry, no edema NEUROLOGIC:  Alert and oriented x 3. No focal neuro deficits noted. PSYCHIATRIC:  Normal affect    ASSESSMENT:    1. Dilated cardiomyopathy   2. Chronic combined systolic and diastolic heart failure  3. Paroxysmal atrial fibrillation   4. Essential hypertension   5. Cardiac risk counseling   6. Counseling on health promotion and disease prevention     PLAN:    Nonischemic cardiomyopathy, with chronic systolic and diastolic heart failure:  -echo has been very slow to improve despite NSR. Initially thought to be tachycardia induced, may have another component of NICM -NYHA class I-II -continue carvedilol 12.5 mg BID -continue entresto 97-103 mg BID -EF above ICD level, but may need CRT in the future given LBBB. Reviewed again today. If she is on GDMT and develops NYHA class 3 symptoms, would refer to EP. -discussed SGLT2i again, she is amenable to trying [place] Pepper (appears covered, Marcelline Deist is not). Will give samples today. Check BMET in 3 weeks -she states that she will never take a statin as she feels this contributed to her father's worsening health. Last lipids reviewed.  Hypertension: at goal -continue carvedilol -continue  entresto  paroxysmal atrial fibrillation:  -continue apixaban 5 mg BID CHA2DS2/VAS Stroke Risk Points = 3  -has maintained SR  Cardiac risk counseling and prevention recommendations: -recommend heart healthy/Mediterranean diet, with whole grains, fruits, vegetable, fish, lean meats, nuts, and olive oil. Limit salt. -recommend moderate walking, 3-5 times/week for 30-50 minutes each session. Aim for at least 150 minutes.week. Goal should be pace of 3 miles/hours, or walking 1.5 miles in 30 minutes -recommend avoidance of tobacco products. Avoid excess alcohol. -declines statin  Plan for follow up:  Labs in 3 weeks.  With me in 1 year, or sooner as needed.  Jodelle Red, MD, PhD, [facility] [place]  [facility]   Medication Adjustments/Labs and Tests Ordered: Current medicines are reviewed at length with the patient today.  Concerns regarding medicines are outlined above.   Orders Placed This Encounter  Procedures   Basic metabolic panel   EKG 12-Lead   Meds ordered this encounter  Medications   empagliflozin (JARDIANCE) 10 MG TABS tablet    Sig: Take 1 tablet (10 mg total) by mouth daily before breakfast.    Dispense:  30 tablet    Refill:  11   empagliflozin (JARDIANCE) 10 MG TABS tablet    Sig: Take 1 tablet (10 mg total) by mouth daily before breakfast.    Dispense:  14 tablet    Refill:  0    Order Specific Question:   Lot Number?    Answer:   [id number]    Order Specific Question:   Expiration Date?    Answer:   12/18/2024   Patient Instructions  Medication Instructions:  Jardiance 10 mg daily   *If you need a refill on your cardiac medications before your next appointment, please call your pharmacy*   Lab Work:  BMET in 2 weeks.    Testing/Procedures: N/A   Follow-Up: At [facility], you and your health needs are our priority.  As part of our continuing mission to provide you with exceptional heart care, we have created designated Provider  Care Teams.  These Care Teams include your primary Cardiologist (physician) and Advanced Practice Providers (APPs -  Physician Assistants and Nurse Practitioners) who all work together to provide you with the care you need, when you need it.  We recommend signing up for the patient portal called "MyChart".  Sign up information is provided on this After Visit Summary.  MyChart is used to connect with patients for Virtual Visits (Telemedicine).  Patients are able to view lab/test results, encounter notes, upcoming appointments, etc.  Non-urgent messages can be sent to your provider as well.   To learn more about what you can do with MyChart, go to [email or web address].    Your next appointment:   1 year  Provider:   Jodelle Red, MD    Other Instructions Pt. Assistance forms for your completion     I,Mathew Stumpf,acting as a scribe for [employer], MD.,have documented all relevant documentation on the behalf of Jodelle Red, MD,as directed by  Jodelle Red, MD while in the presence of Jodelle Red, MD.  I, Jodelle Red, MD, have reviewed all documentation for this visit. The documentation on 03/30/23 for the exam, diagnosis, procedures, and orders are all accurate and complete.   Signed, Jodelle Red, MD PhD 03/30/2023    [facility]

## 2023-05-01 LAB — BASIC METABOLIC PANEL
BUN/Creatinine Ratio: 23 (ref 12–28)
BUN: 17 mg/dL (ref 8–27)
CO2: 22 mmol/L (ref 20–29)
Calcium: 8.4 mg/dL — ABNORMAL LOW (ref 8.7–10.3)
Chloride: 105 mmol/L (ref 96–106)
Creatinine, Ser: 0.74 mg/dL (ref 0.57–1.00)
Glucose: 103 mg/dL — ABNORMAL HIGH (ref 70–99)
Potassium: 3.7 mmol/L (ref 3.5–5.2)
Sodium: 142 mmol/L (ref 134–144)
eGFR: 85 mL/min/{1.73_m2} (ref >59)

## 2023-06-15 ENCOUNTER — Other Ambulatory Visit: Payer: Self-pay | Admitting: Cardiology

## 2023-06-15 DIAGNOSIS — I5042 Chronic combined systolic (congestive) and diastolic (congestive) heart failure: Secondary | ICD-10-CM

## 2023-06-15 DIAGNOSIS — I420 Dilated cardiomyopathy: Secondary | ICD-10-CM

## 2023-09-18 ENCOUNTER — Other Ambulatory Visit (HOSPITAL_BASED_OUTPATIENT_CLINIC_OR_DEPARTMENT_OTHER): Payer: Self-pay | Admitting: Cardiology

## 2023-09-18 DIAGNOSIS — I480 Paroxysmal atrial fibrillation: Secondary | ICD-10-CM

## 2023-09-18 DIAGNOSIS — I4819 Other persistent atrial fibrillation: Secondary | ICD-10-CM

## 2023-09-19 NOTE — Telephone Encounter (Signed)
Prescription refill request for Eliquis received. Indication: AF Last office visit: 03/30/23  Ernst Bowler MD Scr: 0.74 on 04/30/23  Epic Age: 72 Weight: 100.9kg  Based on above findings Eliquis 5mg  twice daily is the appropriate dose.  Refill approved.

## 2023-11-21 ENCOUNTER — Telehealth: Payer: Self-pay | Admitting: Cardiology

## 2023-11-21 NOTE — Telephone Encounter (Signed)
   Pre-operative Risk Assessment    Patient Name: Evelyn Phelps  DOB: 03-03-1951 MRN: [id number]     Request for Surgical Clearance    Procedure:  Dental Extraction - Amount of Teeth to be Pulled:  2   Date of Surgery:  Clearance 11/28/23                                 Surgeon:  Dr. Felipa Furnace  Surgeon's Group or Practice Name:  [facility]  Phone number:  [phone] Fax number:  [phone]   Type of Clearance Requested:   - Medical  - Pharmacy:  Hold Apixaban (Eliquis)     Type of Anesthesia:  Local    Additional requests/questions:    Alben Spittle   11/21/2023, 8:56 AM

## 2023-11-21 NOTE — Telephone Encounter (Signed)
   Patient Name: Evelyn Phelps  DOB: [birth year] MRN: [id number]  Primary Cardiologist: Jodelle Red, MD  Chart reviewed as part of pre-operative protocol coverage.   Simple dental extractions (i.e. 1-2 teeth) are considered low risk procedures per guidelines and generally do not require any specific cardiac clearance. It is also generally accepted that for simple extractions and dental cleanings, there is no need to interrupt blood thinner therapy.  SBE prophylaxis is not required for the patient from a cardiac standpoint.  I will route this recommendation to the requesting party via Epic fax function and remove from pre-op pool.  Please call with questions.  Corrin Parker, PA-C 11/21/2023, 9:05 AM

## 2024-03-15 ENCOUNTER — Other Ambulatory Visit (HOSPITAL_BASED_OUTPATIENT_CLINIC_OR_DEPARTMENT_OTHER): Payer: Self-pay | Admitting: Cardiology

## 2024-03-15 DIAGNOSIS — I10 Essential (primary) hypertension: Secondary | ICD-10-CM

## 2024-03-17 ENCOUNTER — Other Ambulatory Visit (HOSPITAL_BASED_OUTPATIENT_CLINIC_OR_DEPARTMENT_OTHER): Payer: Self-pay | Admitting: Cardiology

## 2024-03-17 DIAGNOSIS — I4819 Other persistent atrial fibrillation: Secondary | ICD-10-CM

## 2024-03-17 DIAGNOSIS — I480 Paroxysmal atrial fibrillation: Secondary | ICD-10-CM

## 2024-03-17 NOTE — Telephone Encounter (Signed)
 Prescription refill request for Eliquis received. Indication: AF Last office visit: 03/30/23  Ernst Bowler MD Scr: 0.74 on 04/30/23 Age: 73 Weight: 100.9kg  Based on above findings Eliquis 5mg  twice daily is the appropriate dose.  Refill approved.

## 2024-06-13 ENCOUNTER — Other Ambulatory Visit (HOSPITAL_BASED_OUTPATIENT_CLINIC_OR_DEPARTMENT_OTHER): Payer: Self-pay | Admitting: Cardiology

## 2024-06-13 DIAGNOSIS — I420 Dilated cardiomyopathy: Secondary | ICD-10-CM

## 2024-06-13 DIAGNOSIS — I10 Essential (primary) hypertension: Secondary | ICD-10-CM

## 2024-06-13 DIAGNOSIS — I5042 Chronic combined systolic (congestive) and diastolic (congestive) heart failure: Secondary | ICD-10-CM

## 2024-07-01 ENCOUNTER — Encounter (HOSPITAL_BASED_OUTPATIENT_CLINIC_OR_DEPARTMENT_OTHER): Payer: Self-pay | Admitting: Family

## 2024-07-01 ENCOUNTER — Ambulatory Visit (HOSPITAL_BASED_OUTPATIENT_CLINIC_OR_DEPARTMENT_OTHER): Admitting: Family

## 2024-07-01 VITALS — BP 132/82 | HR 70 | Ht 67.0 in | Wt 221.0 lb

## 2024-07-01 DIAGNOSIS — D6859 Other primary thrombophilia: Secondary | ICD-10-CM

## 2024-07-01 DIAGNOSIS — I420 Dilated cardiomyopathy: Secondary | ICD-10-CM

## 2024-07-01 DIAGNOSIS — I480 Paroxysmal atrial fibrillation: Secondary | ICD-10-CM | POA: Diagnosis not present

## 2024-07-01 DIAGNOSIS — I5042 Chronic combined systolic (congestive) and diastolic (congestive) heart failure: Secondary | ICD-10-CM

## 2024-07-01 DIAGNOSIS — I10 Essential (primary) hypertension: Secondary | ICD-10-CM | POA: Diagnosis not present

## 2024-07-01 DIAGNOSIS — Z6834 Body mass index (BMI) 34.0-34.9, adult: Secondary | ICD-10-CM

## 2024-07-01 MED ORDER — APIXABAN 5 MG PO TABS: 5.0000 mg | ORAL_TABLET | Freq: Two times a day (BID) | ORAL | 1 refills | Status: AC

## 2024-07-01 MED ORDER — SACUBITRIL-VALSARTAN 97-103 MG PO TABS: 1.0000 | ORAL_TABLET | Freq: Two times a day (BID) | ORAL | 1 refills | Status: DC

## 2024-07-01 MED ORDER — CARVEDILOL 12.5 MG PO TABS: 12.5000 mg | ORAL_TABLET | Freq: Two times a day (BID) | ORAL | 1 refills | Status: DC

## 2024-07-01 MED ORDER — FUROSEMIDE 40 MG PO TABS: 40.0000 mg | ORAL_TABLET | ORAL | 1 refills | Status: DC | PRN

## 2024-07-01 NOTE — Patient Instructions (Addendum)
 Medication Instructions:  Continue your current medications.   *If you need a refill on your cardiac medications before your next appointment, please call your pharmacy*  Lab Work: Your physician recommends that you return for lab work one day this week or next for fasting labs: lipid panel, CMET, CBC, BNP, A1c  If you have labs (blood work) drawn today and your tests are completely normal, you will receive your results only by: MyChart Message (if you have MyChart) OR A paper copy in the mail If you have any lab test that is abnormal or we need to change your treatment, we will call you to review the results.  Testing/Procedures: Your EKG today looked great!  Your physician has requested that you have an echocardiogram. Echocardiography is a painless test that uses sound waves to create images of your heart. It provides your doctor with information about the size and shape of your heart and how well your heart's chambers and valves are working. This procedure takes approximately one hour. There are no restrictions for this procedure. Please do NOT wear cologne, perfume, aftershave, or lotions (deodorant is allowed). Please arrive 15 minutes prior to your appointment time.  Please note: We ask at that you not bring children with you during ultrasound (echo/ vascular) testing. Due to room size and safety concerns, children are not allowed in the ultrasound rooms during exams. Our front office staff cannot provide observation of children in our lobby area while testing is being conducted. An adult accompanying a patient to their appointment will only be allowed in the ultrasound room at the discretion of the ultrasound technician under special circumstances. We apologize for any inconvenience.   Follow-Up: At [facility], you and your health needs are our priority.  As part of our continuing mission to provide you with exceptional heart care, our providers are all part of one team.   This team includes your primary Cardiologist (physician) and Advanced Practice Providers or APPs (Physician Assistants and Nurse Practitioners) who all work together to provide you with the care you need, when you need it.  Your next appointment:   After your echocardiogram   Provider:   Shelda Bruckner, MD, Rosaline Bane, NP, or Reche Finder, NP    We recommend signing up for the patient portal called MyChart.  Sign up information is provided on this After Visit Summary.  MyChart is used to connect with patients for Virtual Visits (Telemedicine).  Patients are able to view lab/test results, encounter notes, upcoming appointments, etc.  Non-urgent messages can be sent to your provider as well.   To learn more about what you can do with MyChart, go to [email or web address].   Other Instructions  To prevent or reduce lower extremity swelling: Eat a low salt diet. Salt makes the body hold onto extra fluid which causes swelling. Sit with legs elevated. For example, in the recliner or on an ottoman.  Wear knee-high compression stockings during the daytime. Ones labeled 15-20 mmHg provide good compression.  Heart Healthy Diet Recommendations: A low-salt diet is recommended. Meats should be grilled, baked, or boiled. Avoid fried foods. Focus on lean protein sources like fish or chicken with vegetables and fruits. The American Heart Association is a Chief Technology Officer!  American Heart Association Diet and Lifeystyle Recommendations   Exercise recommendations: The American Heart Association recommends 150 minutes of moderate intensity exercise weekly. Try 30 minutes of moderate intensity exercise 4-5 times per week. This could include walking, jogging, or swimming.

## 2024-07-01 NOTE — Progress Notes (Signed)
 Cardiology Office Note   Date:  07/01/2024  ID:  Evelyn Phelps, DOB 01/03/1951, MRN [id number] PCP: Patient, No Pcp Per   [facility] Providers Cardiologist:  Shelda Bruckner, MD     History of Present Illness Evelyn Phelps is a 73 y.o. female with a hx of chronic systolic and diastolic heart failure, NICM, LBBB, PAF, HTN.  Admitted 09/2019 with new onset acute combined systolic and diastolic heart failure with A-fib with RVR.  Underwent TEE-DCCV.  She required repeat cardioversion thereafter maintaining sinus rhythm though persistently reduced LVEF. Has previously stated she will never take a statin as she feels it contributed to her father's worsening health.  Most recent echo 04/2020 LVEF 35-40%, RV normal, mild MR, mild aortic valve sclerosis without stenosis.  Last seen 03/30/2023.  SGLT2i again discussed.  She was recommended continue carvedilol  12.5 mg twice daily, Entresto  97-103 mg twice daily.  Jardiance  10 mg daily initiated.  Presents today for follow up independently. She notes more difficulty with exertional dyspnea. She has been taking her Lasix  more frequently due to lower extremity edema. Swelling is worse by end of the day and improves with compression stockings. She stopped Jardiance  after 2 weeks as she did not feel it was beneficial. Occasional palpitations but she recovers quickly and does not interrupt daily activities.   ROS: Please see the history of present illness.    All other systems reviewed and are negative.   Studies Reviewed EKG Interpretation Date/Time:  Tuesday July 01 2024 11:37:22 EDT Ventricular Rate:  70 PR Interval:  222 QRS Duration:  172 QT Interval:  470 QTC Calculation: 507 R Axis:   -44  Text Interpretation: Sinus rhythm with 1st degree A-V block Left axis deviation Left bundle branch block Confirmed by Vannie Mora ([id number]) on 07/01/2024 11:39:39 AM    Cardiac Studies & Procedures    ______________________________________________________________________________________________ CARDIAC CATHETERIZATION  CARDIAC CATHETERIZATION 10/01/2019  Conclusion  Prox RCA lesion is 20% stenosed.  There is severe left ventricular systolic dysfunction.  LV end diastolic pressure is normal.  The left ventricular ejection fraction is 25-35% by visual estimate.  1.  Mild stenosis in a small nondominant right coronary artery.  Otherwise normal coronary arteries. 2.  Severely reduced LV systolic function. 3.  Normal left ventricular end-diastolic pressure.  Recommendations: The patient has nonischemic cardiomyopathy likely tachycardia induced. Recommend medical therapy for cardiomyopathy. Consider TEE guided cardioversion tomorrow.  I am going to start the patient on Eliquis  5 mg twice daily to be started this evening.  Findings Coronary Findings Diagnostic  Dominance: Left  Left Main Vessel is angiographically normal.  Ramus Intermedius Vessel is angiographically normal.  Left Circumflex Vessel is angiographically normal.  Right Coronary Artery Prox RCA lesion is 20% stenosed.  Intervention  No interventions have been documented.     ECHOCARDIOGRAM  ECHOCARDIOGRAM COMPLETE 04/26/2020  Narrative ECHOCARDIOGRAM REPORT    Patient Name:   Evelyn Phelps  Date of Exam: 04/26/2020 Medical Rec #:  [id number]     Height:       66.0 in Accession #:    [id number]    Weight:       193.1 lb Date of Birth:  1951/09/01     BSA:          1.970 m Patient Age:    68 years      BP:           144/76 mmHg Patient Gender: F  HR:           70 bpm. Exam Location:  [address]  Procedure: 2D Echo, 3D Echo, Cardiac Doppler and Color Doppler  Indications:    I42.9 Cardiomyopathy (unspecified)  History:        Patient has prior history of Echocardiogram examinations, most recent 01/09/2020. CHF, Arrythmias:Atrial Fibrillation; Risk Factors:Former Smoker.  Prediabetes.  Sonographer:    Jon Hacker RCS Referring Phys: [phone] BRIDGETTE CHRISTOPHER  IMPRESSIONS   1. Left ventricular ejection fraction, by estimation, is 35-40%. The left ventricle has normal function. The left ventricle has no regional wall motion abnormalities. There is akinesis of the left ventricular, basal-mid anteroseptal wall and inferoseptal wall. There is akinesis of the left ventricular, entire inferior wall. The average left ventricular global longitudinal strain is abnormal at 14.1 %. The global longitudinal strain is normal. 2. Right ventricular systolic function is normal. The right ventricular size is normal. Tricuspid regurgitation signal is inadequate for assessing PA pressure. 3. The mitral valve is normal in structure. Mild mitral valve regurgitation. No evidence of mitral stenosis. 4. The aortic valve is tricuspid. Aortic valve regurgitation is not visualized. Mild aortic valve sclerosis is present, with no evidence of aortic valve stenosis. 5. The inferior vena cava is normal in size with greater than 50% respiratory variability, suggesting right atrial pressure of 3 mmHg. 6. Left atrial size was mildly dilated.  FINDINGS Left Ventricle: Left ventricular ejection fraction, by estimation, is 35 to 40%. The left ventricle has moderately decreased function. The left ventricle has no regional wall motion abnormalities. The average left ventricular global longitudinal strain is 14.1 %. The global longitudinal strain is normal. The left ventricular internal cavity size was moderately dilated. There is no left ventricular hypertrophy. Abnormal (paradoxical) septal motion, consistent with left bundle branch block. Left ventricular diastolic parameters are consistent with Grade I diastolic dysfunction (impaired relaxation).  Right Ventricle: The right ventricular size is normal. No increase in right ventricular wall thickness. Right ventricular systolic function is  normal. Tricuspid regurgitation signal is inadequate for assessing PA pressure.  Left Atrium: Left atrial size was mildly dilated.  Right Atrium: Right atrial size was normal in size.  Pericardium: There is no evidence of pericardial effusion.  Mitral Valve: The mitral valve is normal in structure. Normal mobility of the mitral valve leaflets. Mild to moderate mitral annular calcification. Mild mitral valve regurgitation. No evidence of mitral valve stenosis.  Tricuspid Valve: The tricuspid valve is normal in structure. Tricuspid valve regurgitation is mild . No evidence of tricuspid stenosis.  Aortic Valve: The aortic valve is tricuspid. Aortic valve regurgitation is not visualized. Mild aortic valve sclerosis is present, with no evidence of aortic valve stenosis.  Pulmonic Valve: The pulmonic valve was normal in structure. Pulmonic valve regurgitation is trivial. No evidence of pulmonic stenosis.  Aorta: The aortic root is normal in size and structure.  Venous: The inferior vena cava is normal in size with greater than 50% respiratory variability, suggesting right atrial pressure of 3 mmHg.  IAS/Shunts: No atrial level shunt detected by color flow Doppler.   LEFT VENTRICLE PLAX 2D LVIDd:         5.90 cm  Diastology LVIDs:         4.90 cm  LV e' lateral:   6.64 cm/s LV PW:         1.40 cm  LV E/e' lateral: 9.3 LV IVS:        1.10 cm  LV e' medial:  4.13 cm/s LVOT diam:     2.50 cm  LV E/e' medial:  15.0 LV SV:         79 LV SV Index:   40       2D Longitudinal Strain LVOT Area:     4.91 cm 2D Strain GLS (A2C):   -14.4 % 2D Strain GLS (A3C):   16.1 % 2D Strain GLS (A4C):   -11.9 % 2D Strain GLS Avg:     14.1 %  3D Volume EF: 3D EF:        47 % LV EDV:       238 ml LV ESV:       126 ml LV SV:        112 ml  RIGHT VENTRICLE RV Basal diam:  2.00 cm RV S prime:     14.70 cm/s TAPSE (M-mode): 2.2 cm  LEFT ATRIUM             Index       RIGHT ATRIUM           Index LA  diam:        4.20 cm 2.13 cm/m  RA Area:     13.40 cm LA Vol (A2C):   56.3 ml 28.58 ml/m RA Volume:   28.50 ml  14.47 ml/m LA Vol (A4C):   67.1 ml 34.06 ml/m LA Biplane Vol: 61.0 ml 30.96 ml/m AORTIC VALVE LVOT Vmax:   80.00 cm/s LVOT Vmean:  48.000 cm/s LVOT VTI:    0.161 m  AORTA Ao Root diam: 3.20 cm  MITRAL VALVE MV Area (PHT): 3.27 cm     SHUNTS MV Decel Time: 232 msec     Systemic VTI:  0.16 m MV E velocity: 61.80 cm/s   Systemic Diam: 2.50 cm MV A velocity: 107.00 cm/s MV E/A ratio:  0.58  Wilbert Bihari MD Electronically signed by Wilbert Bihari MD Signature Date/Time: 04/26/2020/6:33:06 PM    Final   TEE  ECHO TEE 10/03/2019  Narrative TRANSESOPHOGEAL ECHO REPORT    Patient Name:   Evelyn Phelps Date of Exam: 10/03/2019 Medical Rec #:  [id number]    Height:       66.0 in Accession #:    [id number]   Weight:       192.0 lb Date of Birth:  [birth year]    BSA:          1.97 m Patient Age:    68 years     BP:           125/79 mmHg Patient Gender: F            HR:           80 bpm. Exam Location:  Inpatient   Procedure: Transesophageal Echo, Color Doppler and Cardiac Doppler  Indications:     atrial fibrillation  History:         Patient has prior history of Echocardiogram examinations, most recent 09/29/2019. CHF.  Sonographer:     Tinnie Barefoot Referring Phys:  [id number] BRUNO EMERSON CAMPI Diagnosing Phys: Lonni Nanas MD    PROCEDURE: After discussion of the risks and benefits of a TEE, an informed consent was obtained from the patient. Patients was monitored while under deep sedation. The transesophogeal probe was passed through the esophogus of the patient. The patient developed no complications during the procedure.  IMPRESSIONS   1. No left atrial appendage thrombus. 2. Left ventricular ejection fraction, by visual estimation, is 25 to  30%. The left ventricle has severely decreased function. Moderately increased left ventricular  size. There is mildly increased left ventricular hypertrophy. 3. Global right ventricle has normal systolic function.The right ventricular size is normal. No increase in right ventricular wall thickness. 4. The mitral valve is normal in structure. Mild mitral valve regurgitation. 5. The tricuspid valve is normal in structure. Tricuspid valve regurgitation is mild. 6. The aortic valve is tricuspid Aortic valve regurgitation was not visualized by color flow Doppler. 7. The pulmonic valve was not well visualized. Pulmonic valve regurgitation is not visualized by color flow Doppler. 8. Small patent foramen ovale.  FINDINGS Left Ventricle: Left ventricular ejection fraction, by visual estimation, is 25 to 30%. The left ventricle has severely decreased function. There is mildly increased left ventricular hypertrophy. Concentric left ventricular hypertrophy. Moderately increased left ventricular size.  Right Ventricle: The right ventricular size is normal. No increase in right ventricular wall thickness. Global RV systolic function is has normal systolic function.  Left Atrium: Left atrial size was normal in size. No left atrial appendage thrombus.  Right Atrium: Right atrial size was normal in size  Pericardium: Trivial pericardial effusion is present.  Mitral Valve: The mitral valve is normal in structure. Mild mitral valve regurgitation.  Tricuspid Valve: The tricuspid valve is normal in structure. Tricuspid valve regurgitation is mild by color flow Doppler.  Aortic Valve: The aortic valve is tricuspid. Aortic valve regurgitation was not visualized by color flow Doppler.  Pulmonic Valve: The pulmonic valve was not well visualized. Pulmonic valve regurgitation is not visualized by color flow Doppler.  Aorta: The aortic root and ascending aorta are structurally normal, with no evidence of dilitation.  Shunts: A small patent foramen ovale is detected. Evidence of atrial level shunting detected  by color flow Doppler.   Lonni Nanas MD Electronically signed by Lonni Nanas MD Signature Date/Time: 10/03/2019/4:07:18 PM    Final        ______________________________________________________________________________________________      Risk Assessment/Calculations  CHA2DS2-VASc Score = 5   This indicates a 7.2% annual risk of stroke. The patient's score is based upon: CHF History: 1 HTN History: 1 Diabetes History: 0 Stroke History: 0 Vascular Disease History: 0 Age Score: 2 Gender Score: 1            Physical Exam VS:  BP 132/82   Pulse 70   Ht 5' 7 (1.702 m)   Wt 221 lb (100.2 kg)   BMI 34.61 kg/m        Wt Readings from Last 3 Encounters:  07/01/24 221 lb (100.2 kg)  03/30/23 222 lb 8 oz (100.9 kg)  03/07/22 220 lb (99.8 kg)    GEN: Well nourished, well developed in no acute distress NECK: No JVD; No carotid bruits CARDIAC: RRR, no murmurs, rubs, gallops RESPIRATORY:  Clear to auscultation without rales, wheezing or rhonchi  ABDOMEN: Soft, non-tender, non-distended EXTREMITIES:  No edema; No deformity   ASSESSMENT AND PLAN  Paroxysmal atrial fibrillation / Hypercoagulable state - RRR by EKG today. Reports rare palpitations which self resolve. CHA2DS2-VASc Score = 5 [CHF History: 1, HTN History: 1, Diabetes History: 0, Stroke History: 0, Vascular Disease History: 0, Age Score: 2, Gender Score: 1].  Therefore, the patient's annual risk of stroke is 7.2 %.    Continue Eliquis  5mg  BID. Denies bleeding complications. Does not meet dose reduction criteria. CBC, BMET today.  NICM / Combined systolic and diastolic heart failure - NYHA II-III with DOE. Provided 6 month handicap placard  per her request. Update echocardiogram to reassess LVEF. Anticipate her DOE is multifactorial heart failure, morbid obesity, deconditioning.  Continue lasix  40mg  PRN, entresot 97-103mg  BID, coreg  12.5mg  BID. Previously stopped jardiance  after a couple weeks as  did not perceive benefit, pending results of BNP/echo will likely resume SLGT2i. CBC, BNP today  Obesity - Weight loss via diet and exercise encouraged. Discussed the impact being overweight would have on cardiovascular risk. A1c today.   HTN - BP reasonably controlled. Continue Entresto  97-103mg  BID, Carvedilol  12.5mg  BID. Discussed to monitor BP at home at least 2 hours after medications and sitting for 5-10 minutes.        Dispo: follow up after echocardiogram with Dr. Lonni or APP  Signed, Reche GORMAN Finder, NP

## 2024-07-04 ENCOUNTER — Ambulatory Visit (HOSPITAL_BASED_OUTPATIENT_CLINIC_OR_DEPARTMENT_OTHER): Payer: Self-pay | Admitting: Family

## 2024-07-04 DIAGNOSIS — I5042 Chronic combined systolic (congestive) and diastolic (congestive) heart failure: Secondary | ICD-10-CM

## 2024-07-04 DIAGNOSIS — I10 Essential (primary) hypertension: Secondary | ICD-10-CM

## 2024-07-04 LAB — CBC
Hematocrit: 38 % (ref 34.0–46.6)
Hemoglobin: 12.7 g/dL (ref 11.1–15.9)
MCH: 32.2 pg (ref 26.6–33.0)
MCHC: 33.4 g/dL (ref 31.5–35.7)
MCV: 96 fL (ref 79–97)
Platelets: 197 x10E3/uL (ref 150–450)
RBC: 3.94 x10E6/uL (ref 3.77–5.28)
RDW: 11.9 % (ref 11.7–15.4)
WBC: 4.8 x10E3/uL (ref 3.4–10.8)

## 2024-07-04 LAB — COMPREHENSIVE METABOLIC PANEL WITH GFR
ALT: 10 IU/L (ref 0–32)
AST: 14 IU/L (ref 0–40)
Albumin: 3.9 g/dL (ref 3.8–4.8)
Alkaline Phosphatase: 88 IU/L (ref 44–121)
BUN/Creatinine Ratio: 23 (ref 12–28)
BUN: 18 mg/dL (ref 8–27)
Bilirubin Total: 0.5 mg/dL (ref 0.0–1.2)
CO2: 26 mmol/L (ref 20–29)
Calcium: 8.5 mg/dL — ABNORMAL LOW (ref 8.7–10.3)
Chloride: 102 mmol/L (ref 96–106)
Creatinine, Ser: 0.78 mg/dL (ref 0.57–1.00)
Globulin, Total: 2.5 g/dL (ref 1.5–4.5)
Glucose: 105 mg/dL — ABNORMAL HIGH (ref 70–99)
Potassium: 3.6 mmol/L (ref 3.5–5.2)
Sodium: 140 mmol/L (ref 134–144)
Total Protein: 6.4 g/dL (ref 6.0–8.5)
eGFR: 79 mL/min/1.73 (ref >59)

## 2024-07-04 LAB — LIPID PANEL
Chol/HDL Ratio: 3.5 ratio (ref 0.0–4.4)
Cholesterol, Total: 211 mg/dL — ABNORMAL HIGH (ref 100–199)
HDL: 61 mg/dL (ref >39)
LDL Chol Calc (NIH): 135 mg/dL — ABNORMAL HIGH (ref 0–99)
Triglycerides: 82 mg/dL (ref 0–149)
VLDL Cholesterol Cal: 15 mg/dL (ref 5–40)

## 2024-07-04 LAB — HEMOGLOBIN A1C
Est. average glucose Bld gHb Est-mCnc: 128 mg/dL
Hgb A1c MFr Bld: 6.1 % — ABNORMAL HIGH (ref 4.8–5.6)

## 2024-07-04 LAB — BRAIN NATRIURETIC PEPTIDE: BNP: 370.7 pg/mL — ABNORMAL HIGH (ref 0.0–100.0)

## 2024-07-04 MED ORDER — FUROSEMIDE 40 MG PO TABS: ORAL_TABLET | ORAL | 3 refills | Status: DC

## 2024-07-10 ENCOUNTER — Encounter (HOSPITAL_BASED_OUTPATIENT_CLINIC_OR_DEPARTMENT_OTHER): Payer: Self-pay

## 2024-07-10 NOTE — Telephone Encounter (Signed)
 Do you want the patient to hold off on the BNP and BMP until upper respiratory symptoms subside?

## 2024-07-16 ENCOUNTER — Other Ambulatory Visit (HOSPITAL_BASED_OUTPATIENT_CLINIC_OR_DEPARTMENT_OTHER): Payer: Self-pay | Admitting: *Deleted

## 2024-07-16 DIAGNOSIS — I10 Essential (primary) hypertension: Secondary | ICD-10-CM

## 2024-07-16 DIAGNOSIS — I5042 Chronic combined systolic (congestive) and diastolic (congestive) heart failure: Secondary | ICD-10-CM

## 2024-07-17 ENCOUNTER — Ambulatory Visit (HOSPITAL_BASED_OUTPATIENT_CLINIC_OR_DEPARTMENT_OTHER): Payer: Self-pay | Admitting: Family

## 2024-07-17 DIAGNOSIS — I5020 Unspecified systolic (congestive) heart failure: Secondary | ICD-10-CM

## 2024-07-17 DIAGNOSIS — E876 Hypokalemia: Secondary | ICD-10-CM

## 2024-07-17 LAB — BASIC METABOLIC PANEL WITH GFR
BUN/Creatinine Ratio: 31 — ABNORMAL HIGH (ref 12–28)
BUN: 37 mg/dL — ABNORMAL HIGH (ref 8–27)
CO2: 25 mmol/L (ref 20–29)
Calcium: 8.5 mg/dL — ABNORMAL LOW (ref 8.7–10.3)
Chloride: 95 mmol/L — ABNORMAL LOW (ref 96–106)
Creatinine, Ser: 1.2 mg/dL — ABNORMAL HIGH (ref 0.57–1.00)
Glucose: 178 mg/dL — ABNORMAL HIGH (ref 70–99)
Potassium: 3.2 mmol/L — ABNORMAL LOW (ref 3.5–5.2)
Sodium: 139 mmol/L (ref 134–144)
eGFR: 47 mL/min/1.73 — ABNORMAL LOW (ref >59)

## 2024-07-17 LAB — BRAIN NATRIURETIC PEPTIDE: BNP: 145.8 pg/mL — ABNORMAL HIGH (ref 0.0–100.0)

## 2024-07-17 MED ORDER — FUROSEMIDE 20 MG PO TABS: 20.0000 mg | ORAL_TABLET | Freq: Every day | ORAL | 2 refills | Status: DC

## 2024-07-23 LAB — BASIC METABOLIC PANEL WITH GFR
BUN/Creatinine Ratio: 21 (ref 12–28)
BUN: 21 mg/dL (ref 8–27)
CO2: 27 mmol/L (ref 20–29)
Calcium: 8.5 mg/dL — ABNORMAL LOW (ref 8.7–10.3)
Chloride: 99 mmol/L (ref 96–106)
Creatinine, Ser: 1.02 mg/dL — ABNORMAL HIGH (ref 0.57–1.00)
Glucose: 116 mg/dL — ABNORMAL HIGH (ref 70–99)
Potassium: 3.5 mmol/L (ref 3.5–5.2)
Sodium: 141 mmol/L (ref 134–144)
eGFR: 57 mL/min/1.73 — ABNORMAL LOW (ref >59)

## 2024-08-01 ENCOUNTER — Ambulatory Visit (HOSPITAL_BASED_OUTPATIENT_CLINIC_OR_DEPARTMENT_OTHER)

## 2024-08-01 DIAGNOSIS — I420 Dilated cardiomyopathy: Secondary | ICD-10-CM | POA: Diagnosis not present

## 2024-08-01 DIAGNOSIS — I480 Paroxysmal atrial fibrillation: Secondary | ICD-10-CM | POA: Diagnosis not present

## 2024-08-01 LAB — ECHOCARDIOGRAM COMPLETE
Area-P 1/2: 3.89 cm2
MV VTI: 1.18 cm2
Radius: 0.6 cm
S' Lateral: 4.48 cm

## 2024-08-11 ENCOUNTER — Other Ambulatory Visit (HOSPITAL_BASED_OUTPATIENT_CLINIC_OR_DEPARTMENT_OTHER): Payer: Self-pay | Admitting: *Deleted

## 2024-08-11 MED ORDER — EMPAGLIFLOZIN 10 MG PO TABS: 10.0000 mg | ORAL_TABLET | Freq: Every day | ORAL | 3 refills | Status: AC

## 2024-08-11 MED ORDER — EMPAGLIFLOZIN 10 MG PO TABS: 10.0000 mg | ORAL_TABLET | Freq: Every day | ORAL | Status: DC

## 2024-08-18 DIAGNOSIS — B029 Zoster without complications: Secondary | ICD-10-CM

## 2024-08-18 HISTORY — DX: Zoster without complications: B02.9

## 2024-08-29 ENCOUNTER — Other Ambulatory Visit (HOSPITAL_BASED_OUTPATIENT_CLINIC_OR_DEPARTMENT_OTHER): Payer: Self-pay | Admitting: Family

## 2024-08-29 DIAGNOSIS — I5020 Unspecified systolic (congestive) heart failure: Secondary | ICD-10-CM

## 2024-08-31 ENCOUNTER — Ambulatory Visit (HOSPITAL_BASED_OUTPATIENT_CLINIC_OR_DEPARTMENT_OTHER): Payer: Self-pay | Admitting: Family

## 2024-08-31 LAB — BASIC METABOLIC PANEL WITH GFR
BUN/Creatinine Ratio: 30 — ABNORMAL HIGH (ref 12–28)
BUN: 27 mg/dL (ref 8–27)
CO2: 22 mmol/L (ref 20–29)
Calcium: 9.2 mg/dL (ref 8.7–10.3)
Chloride: 102 mmol/L (ref 96–106)
Creatinine, Ser: 0.9 mg/dL (ref 0.57–1.00)
Glucose: 148 mg/dL — ABNORMAL HIGH (ref 70–99)
Potassium: 4 mmol/L (ref 3.5–5.2)
Sodium: 139 mmol/L (ref 134–144)
eGFR: 67 mL/min/1.73 (ref >59)

## 2024-08-31 LAB — BRAIN NATRIURETIC PEPTIDE: BNP: 231.9 pg/mL — ABNORMAL HIGH (ref 0.0–100.0)

## 2024-09-02 ENCOUNTER — Telehealth: Payer: Self-pay | Admitting: Family

## 2024-09-02 NOTE — Telephone Encounter (Signed)
  Reche GORMAN Finder, NP 08/31/2024  3:56 PM EDT     BNP with volume overload. Normal kidney function and electrolytes.    Due to volume overload, recommend Lasix  40mg  daily for 3 days then return to 20mg  daily.     The patient has been notified of the result and verbalized understanding.  All questions (if any) were answered.  Pt aware to increase her lasix  to 40 mg po daily for 3 days only then decrease back to taking lasix  20 mg po daily thereafter.   Pt states she has plenty of her 20 mg tablets of lasix  on hand and will double up on those for the next 3 days, then reduce back to taking 20 mg po daily thereafter.   Pt verbalized understanding and agrees with this plan.

## 2024-09-02 NOTE — Telephone Encounter (Signed)
 Patient returned [name]'s call.

## 2024-09-16 ENCOUNTER — Other Ambulatory Visit (HOSPITAL_BASED_OUTPATIENT_CLINIC_OR_DEPARTMENT_OTHER): Payer: Self-pay | Admitting: Family

## 2024-09-16 DIAGNOSIS — I5020 Unspecified systolic (congestive) heart failure: Secondary | ICD-10-CM

## 2024-09-30 ENCOUNTER — Ambulatory Visit (HOSPITAL_BASED_OUTPATIENT_CLINIC_OR_DEPARTMENT_OTHER): Admitting: Family

## 2024-09-30 ENCOUNTER — Encounter (HOSPITAL_BASED_OUTPATIENT_CLINIC_OR_DEPARTMENT_OTHER): Payer: Self-pay | Admitting: Family

## 2024-09-30 VITALS — BP 120/72 | HR 63 | Ht 66.0 in | Wt 205.8 lb

## 2024-09-30 DIAGNOSIS — I447 Left bundle-branch block, unspecified: Secondary | ICD-10-CM | POA: Diagnosis not present

## 2024-09-30 DIAGNOSIS — I5020 Unspecified systolic (congestive) heart failure: Secondary | ICD-10-CM | POA: Diagnosis not present

## 2024-09-30 DIAGNOSIS — I480 Paroxysmal atrial fibrillation: Secondary | ICD-10-CM

## 2024-09-30 DIAGNOSIS — D6859 Other primary thrombophilia: Secondary | ICD-10-CM | POA: Diagnosis not present

## 2024-09-30 MED ORDER — SPIRONOLACTONE 25 MG PO TABS: 12.5000 mg | ORAL_TABLET | Freq: Every day | ORAL | 1 refills | Status: AC

## 2024-09-30 NOTE — Progress Notes (Signed)
 Cardiology Office Note   Date:  09/30/2024  ID:  Evelyn Phelps, DOB 07-16-1951, MRN [id number] PCP: Patient, No Pcp Per  [facility] Providers Cardiologist:  Shelda Bruckner, MD     History of Present Illness Evelyn Phelps is a 73 y.o. female with a hx of chronic systolic and diastolic heart failure, NICM, LBBB, PAF, HTN.  Admitted 09/2019 with new onset acute combined systolic and diastolic heart failure with A-fib with RVR.  Underwent TEE-DCCV.  She required repeat cardioversion thereafter maintaining sinus rhythm though persistently reduced LVEF. Has previously stated she will never take a statin as she feels it contributed to her father's worsening health.  Most recent echo 04/2020 LVEF 35-40%, RV normal, mild MR, mild aortic valve sclerosis without stenosis.  Seen 03/30/2023.  SGLT2i again discussed.  She was recommended continue carvedilol  12.5 mg twice daily, Entresto  97-103 mg twice daily.  Jardiance  10 mg daily initiated.  At last visit due to exertional dyspnea, echo updated with LVEF 30-35%. Jardiance  10mg  daily was added. She has required multiple short courses of increased Lasix  since that time.   Presents today for follow up independently. She reports shingles onset 08/09/24. Reports her rash dried up, still with flat rash just below her rash. She saw dermatology,she was outside of 72 hour window, antiviral not initiated. She has not had chicken pox, shingles, nor shingles vaccine previously. She completed one month of topical steroid. Weight loss of 16 lbs since clinic visit 3 months ago. Reports persistently exertional dyspnea with activities such as walking to the mailbox, walking into the office today. No significant improvement in dyspnea with Jardiance  though did help her LE edema. Sleeping on sofa with pillows propped up for the last 3-5 years, has not added extra pillows recently. No chest pain, pressure, tightness  ROS: Please see the history of present illness.     All other systems reviewed and are negative.   Studies Reviewed      Cardiac Studies & Procedures   ______________________________________________________________________________________________ CARDIAC CATHETERIZATION  CARDIAC CATHETERIZATION 10/01/2019  Conclusion  Prox RCA lesion is 20% stenosed.  There is severe left ventricular systolic dysfunction.  LV end diastolic pressure is normal.  The left ventricular ejection fraction is 25-35% by visual estimate.  1.  Mild stenosis in a small nondominant right coronary artery.  Otherwise normal coronary arteries. 2.  Severely reduced LV systolic function. 3.  Normal left ventricular end-diastolic pressure.  Recommendations: The patient has nonischemic cardiomyopathy likely tachycardia induced. Recommend medical therapy for cardiomyopathy. Consider TEE guided cardioversion tomorrow.  I am going to start the patient on Eliquis  5 mg twice daily to be started this evening.  Findings Coronary Findings Diagnostic  Dominance: Left  Left Main Vessel is angiographically normal.  Ramus Intermedius Vessel is angiographically normal.  Left Circumflex Vessel is angiographically normal.  Right Coronary Artery Prox RCA lesion is 20% stenosed.  Intervention  No interventions have been documented.     ECHOCARDIOGRAM  ECHOCARDIOGRAM COMPLETE 08/01/2024  Narrative ECHOCARDIOGRAM REPORT    Patient Name:   Evelyn Phelps Date of Exam: 08/01/2024 Medical Rec #:  [id number]    Height:       67.0 in Accession #:    [id number]   Weight:       221.0 lb Date of Birth:  10/03/1951    BSA:          2.110 m Patient Age:    73 years     BP:  132/82 mmHg Patient Gender: F            HR:           70 bpm. Exam Location:  Outpatient  Procedure: 2D Echo, 3D Echo, Cardiac Doppler and Color Doppler (Both Spectral and Color Flow Doppler were utilized during procedure).  Indications:     CHF  History:         Patient has prior  history of Echocardiogram examinations, most recent 04/26/2020. Arrythmias:Atrial Fibrillation and LBBB; Risk Factors:Former Smoker and Hypertension. Dilated Cardiomyopathy.  Sonographer:     Orvil Holmes RDCS Referring Phys:  [id number] RECHE GORMAN FINDER Diagnosing Phys: Lonni Nanas MD  IMPRESSIONS   1. Left ventricular ejection fraction, by estimation, is 30 to 35%. The left ventricle has moderately decreased function. The left ventricle demonstrates regional wall motion abnormalities (see scoring diagram/findings for description). The left ventricular internal cavity size was moderately dilated. There is moderate left ventricular hypertrophy. Left ventricular diastolic parameters are indeterminate. Abnormal (paradoxical) septal motion, consistent with left bundle branch block Septal and inferior akinesis. 2. Right ventricular systolic function is normal. The right ventricular size is normal. Tricuspid regurgitation signal is inadequate for assessing PA pressure. 3. The mitral valve is normal in structure. Trivial mitral valve regurgitation. 4. The aortic valve is tricuspid. Aortic valve regurgitation is not visualized. No aortic stenosis is present. 5. The inferior vena cava is normal in size with greater than 50% respiratory variability, suggesting right atrial pressure of 3 mmHg.  FINDINGS Left Ventricle: Left ventricular ejection fraction, by estimation, is 30 to 35%. The left ventricle has moderately decreased function. The left ventricle demonstrates regional wall motion abnormalities. The left ventricular internal cavity size was moderately dilated. There is moderate left ventricular hypertrophy. Abnormal (paradoxical) septal motion, consistent with left bundle branch block. Left ventricular diastolic parameters are indeterminate.   LV Wall Scoring: The entire septum and entire inferior wall are akinetic. The entire anterior wall, entire lateral wall, and apex are  normal.  Right Ventricle: The right ventricular size is normal. No increase in right ventricular wall thickness. Right ventricular systolic function is normal. Tricuspid regurgitation signal is inadequate for assessing PA pressure.  Left Atrium: Left atrial size was normal in size.  Right Atrium: Right atrial size was normal in size.  Pericardium: Trivial pericardial effusion is present.  Mitral Valve: The mitral valve is normal in structure. Trivial mitral valve regurgitation. MV peak gradient, 5.4 mmHg. The mean mitral valve gradient is 2.0 mmHg.  Tricuspid Valve: The tricuspid valve is normal in structure. Tricuspid valve regurgitation is trivial.  Aortic Valve: The aortic valve is tricuspid. Aortic valve regurgitation is not visualized. No aortic stenosis is present.  Pulmonic Valve: The pulmonic valve was not well visualized. Pulmonic valve regurgitation is not visualized.  Aorta: The aortic root is normal in size and structure.  Venous: The inferior vena cava is normal in size with greater than 50% respiratory variability, suggesting right atrial pressure of 3 mmHg.  IAS/Shunts: The interatrial septum was not well visualized.   LEFT VENTRICLE PLAX 2D LVIDd:         5.93 cm   Diastology LVIDs:         4.48 cm   LV e' medial:    7.83 cm/s LV PW:         1.55 cm   LV E/e' medial:  6.0 LV IVS:        1.17 cm   LV e' lateral:   9.25  cm/s LVOT diam:     2.00 cm   LV E/e' lateral: 5.1 LV SV:         36 LV SV Index:   17 LVOT Area:     3.14 cm  3D Volume EF: 3D EF:        44 % LV EDV:       211 ml LV ESV:       118 ml LV SV:        92 ml  RIGHT VENTRICLE RV Basal diam:  2.86 cm RV Mid diam:    3.14 cm RV S prime:     10.60 cm/s TAPSE (M-mode): 2.2 cm  LEFT ATRIUM           Index        RIGHT ATRIUM           Index LA diam:      3.80 cm 1.80 cm/m   RA Area:     14.90 cm LA Vol (A2C): 67.5 ml 31.99 ml/m  RA Volume:   34.80 ml  16.49 ml/m LA Vol (A4C): 56.3 ml 26.68  ml/m AORTIC VALVE LVOT Vmax:   57.60 cm/s LVOT Vmean:  36.200 cm/s LVOT VTI:    0.116 m  AORTA Ao Root diam: 3.20 cm Ao Asc diam:  3.10 cm  MITRAL VALVE MV Area (PHT): 3.89 cm     SHUNTS MV Area VTI:   1.18 cm     Systemic VTI:  0.12 m MV Peak grad:  5.4 mmHg     Systemic Diam: 2.00 cm MV Mean grad:  2.0 mmHg MV Vmax:       1.16 m/s MV Vmean:      61.7 cm/s MV Decel Time: 195 msec MR PISA:        2.26 cm MR PISA Radius: 0.60 cm MV E velocity: 47.20 cm/s MV A velocity: 103.00 cm/s MV E/A ratio:  0.46  Lonni Nanas MD Electronically signed by Lonni Nanas MD Signature Date/Time: 08/01/2024/7:12:47 PM    Final (Updated)   TEE  ECHO TEE 10/03/2019  Narrative TRANSESOPHOGEAL ECHO REPORT    Patient Name:   MEILY GLOWACKI Date of Exam: 10/03/2019 Medical Rec #:  [id number]    Height:       66.0 in Accession #:    [id number]   Weight:       192.0 lb Date of Birth:  [birth year]    BSA:          1.97 m Patient Age:    68 years     BP:           125/79 mmHg Patient Gender: F            HR:           80 bpm. Exam Location:  Inpatient   Procedure: Transesophageal Echo, Color Doppler and Cardiac Doppler  Indications:     atrial fibrillation  History:         Patient has prior history of Echocardiogram examinations, most recent 09/29/2019. CHF.  Sonographer:     Tinnie Barefoot Referring Phys:  [id number] BRUNO EMERSON CAMPI Diagnosing Phys: Lonni Nanas MD    PROCEDURE: After discussion of the risks and benefits of a TEE, an informed consent was obtained from the patient. Patients was monitored while under deep sedation. The transesophogeal probe was passed through the esophogus of the patient. The patient developed no complications during the procedure.  IMPRESSIONS   1. No  left atrial appendage thrombus. 2. Left ventricular ejection fraction, by visual estimation, is 25 to 30%. The left ventricle has severely decreased function. Moderately  increased left ventricular size. There is mildly increased left ventricular hypertrophy. 3. Global right ventricle has normal systolic function.The right ventricular size is normal. No increase in right ventricular wall thickness. 4. The mitral valve is normal in structure. Mild mitral valve regurgitation. 5. The tricuspid valve is normal in structure. Tricuspid valve regurgitation is mild. 6. The aortic valve is tricuspid Aortic valve regurgitation was not visualized by color flow Doppler. 7. The pulmonic valve was not well visualized. Pulmonic valve regurgitation is not visualized by color flow Doppler. 8. Small patent foramen ovale.  FINDINGS Left Ventricle: Left ventricular ejection fraction, by visual estimation, is 25 to 30%. The left ventricle has severely decreased function. There is mildly increased left ventricular hypertrophy. Concentric left ventricular hypertrophy. Moderately increased left ventricular size.  Right Ventricle: The right ventricular size is normal. No increase in right ventricular wall thickness. Global RV systolic function is has normal systolic function.  Left Atrium: Left atrial size was normal in size. No left atrial appendage thrombus.  Right Atrium: Right atrial size was normal in size  Pericardium: Trivial pericardial effusion is present.  Mitral Valve: The mitral valve is normal in structure. Mild mitral valve regurgitation.  Tricuspid Valve: The tricuspid valve is normal in structure. Tricuspid valve regurgitation is mild by color flow Doppler.  Aortic Valve: The aortic valve is tricuspid. Aortic valve regurgitation was not visualized by color flow Doppler.  Pulmonic Valve: The pulmonic valve was not well visualized. Pulmonic valve regurgitation is not visualized by color flow Doppler.  Aorta: The aortic root and ascending aorta are structurally normal, with no evidence of dilitation.  Shunts: A small patent foramen ovale is detected. Evidence of  atrial level shunting detected by color flow Doppler.   Lonni Nanas MD Electronically signed by Lonni Nanas MD Signature Date/Time: 10/03/2019/4:07:18 PM    Final        ______________________________________________________________________________________________         Risk Assessment/Calculations  CHA2DS2-VASc Score = 5   This indicates a 7.2% annual risk of stroke. The patient's score is based upon: CHF History: 1 HTN History: 1 Diabetes History: 0 Stroke History: 0 Vascular Disease History: 0 Age Score: 2 Gender Score: 1            Physical Exam VS:  BP 120/72   Pulse 63   Ht 5' 6 (1.676 m)   Wt 205 lb 12.8 oz (93.4 kg)   SpO2 95%   BMI 33.22 kg/m        Wt Readings from Last 3 Encounters:  09/30/24 205 lb 12.8 oz (93.4 kg)  07/01/24 221 lb (100.2 kg)  03/30/23 222 lb 8 oz (100.9 kg)    GEN: Well nourished, well developed in no acute distress NECK: No JVD; No carotid bruits CARDIAC: RRR, no murmurs, rubs, gallops RESPIRATORY:  Clear to auscultation without rales, wheezing or rhonchi  ABDOMEN: Soft, non-tender, non-distended EXTREMITIES:  No edema; No deformity   ASSESSMENT AND PLAN  Shingles - treated in August by dermatology. Discussed need to follow up with PCP for post-herpetic neuropathy.  Paroxysmal atrial fibrillation / Hypercoagulable state - RRR by auscultation today. Reports rare palpitations which self resolve. CHA2DS2-VASc Score = 5 [CHF History: 1, HTN History: 1, Diabetes History: 0, Stroke History: 0, Vascular Disease History: 0, Age Score: 2, Gender Score: 1].  Therefore, the patient's annual  risk of stroke is 7.2 %.    Continue Eliquis  5mg  BID. Denies bleeding complications. Does not meet dose reduction criteria.   NICM / Combined systolic and diastolic heart failure -  NYHA II-III with DOE. Provided 6 month handicap placard previously which expires 11/2024, requests permanent handicap placard - discussed plan to  optimize HF therapies and breathing prior to providing permanent placard. LVEF 30-35%.  Anticipate her DOE is multifactorial heart failure, morbid obesity, deconditioning.  Continue lasix  40mg  PRN, Entresto  97-103mg  BID, coreg  12.5mg  BID, Jardiance  10mg  daily. Rx Spironolactone 12.5mg  daily.  Labs in 1-2 weeks for BMP  LBBB - refer to EP for discussion of CRT  Obesity - Weight loss via diet and exercise encouraged. Discussed the impact being overweight would have on cardiovascular risk. Successful weight loss of 16 lbs since clinic visit since last seen 3 months ago  HTN - BP controlled. Continue Entresto  97-103mg  BID, Carvedilol  12.5mg  BID. Discussed to monitor BP at home at least 2 hours after medications and sitting for 5-10 minutes.        Dispo: follow up in3 with Dr. Lonni or APP  Signed, Reche GORMAN Finder, NP

## 2024-09-30 NOTE — Patient Instructions (Addendum)
 Medication Instructions:  START Spironolactone 12.5mg  (half tablet) once per day *If you need a refill on your cardiac medications before your next appointment, please call your pharmacy*  Lab Work: Your physician recommends that you return for lab work in 1-2 weeks for BMET  If you have labs (blood work) drawn today and your tests are completely normal, you will receive your results only by: MyChart Message (if you have MyChart) OR A paper copy in the mail If you have any lab test that is abnormal or we need to change your treatment, we will call you to review the results.  Testing/Procedures: Your echocardiogram showed your heart muscle function was 30-35%. Normal is 50-65%. Your previous echocardiogram in 2021 shoed your heart muscle function was 35-40%. The goal of adding Jardiance  and Spironolactone is to help strengthen the heart muscle function.   Follow-Up: At [facility], you and your health needs are our priority.  As part of our continuing mission to provide you with exceptional heart care, our providers are all part of one team.  This team includes your primary Cardiologist (physician) and Advanced Practice Providers or APPs (Physician Assistants and Nurse Practitioners) who all work together to provide you with the care you need, when you need it.  Your next appointment:   You have been referred to electrophysiology to discuss possible CRT (cardiac resynchronization therapy device) AND In 3 months with Dr. Lonni  We recommend signing up for the patient portal called MyChart.  Sign up information is provided on this After Visit Summary.  MyChart is used to connect with patients for Virtual Visits (Telemedicine).  Patients are able to view lab/test results, encounter notes, upcoming appointments, etc.  Non-urgent messages can be sent to your provider as well.   To learn more about what you can do with MyChart, go to [email or web address].   Other  Instructions  Heart Healthy Diet Recommendations: A low-salt diet is recommended. Meats should be grilled, baked, or boiled. Avoid fried foods. Focus on lean protein sources like fish or chicken with vegetables and fruits. The American Heart Association is a Chief Technology Officer!  American Heart Association Diet and Lifeystyle Recommendations   Recommend restrict to less than 2 liters (64 oz) of fluid intake each day.  To prevent or reduce lower extremity swelling: Eat a low salt diet. Salt makes the body hold onto extra fluid which causes swelling. Sit with legs elevated. For example, in the recliner or on an ottoman.  Wear knee-high compression stockings during the daytime. Ones labeled 15-20 mmHg provide good compression.

## 2024-10-06 ENCOUNTER — Encounter (HOSPITAL_BASED_OUTPATIENT_CLINIC_OR_DEPARTMENT_OTHER): Payer: Self-pay | Admitting: Family

## 2024-10-06 ENCOUNTER — Encounter (HOSPITAL_BASED_OUTPATIENT_CLINIC_OR_DEPARTMENT_OTHER): Payer: Self-pay

## 2024-10-20 ENCOUNTER — Telehealth (HOSPITAL_BASED_OUTPATIENT_CLINIC_OR_DEPARTMENT_OTHER): Payer: Self-pay | Admitting: Cardiology

## 2024-10-20 NOTE — Telephone Encounter (Signed)
 Spoke with patient who received generic Entresto    Advised ok to the generic and if has any issues to let us  know

## 2024-10-20 NOTE — Telephone Encounter (Signed)
 Pt stopped by to ask about her entresto  prescription. She was told she got a generic form. She would like a call pertaining to the prescription, she has several questions.

## 2024-10-21 ENCOUNTER — Ambulatory Visit (HOSPITAL_BASED_OUTPATIENT_CLINIC_OR_DEPARTMENT_OTHER): Payer: Self-pay | Admitting: Family

## 2024-10-21 LAB — BASIC METABOLIC PANEL WITH GFR
BUN/Creatinine Ratio: 20 (ref 12–28)
BUN: 20 mg/dL (ref 8–27)
CO2: 24 mmol/L (ref 20–29)
Calcium: 9.2 mg/dL (ref 8.7–10.3)
Chloride: 102 mmol/L (ref 96–106)
Creatinine, Ser: 0.98 mg/dL (ref 0.57–1.00)
Glucose: 164 mg/dL — ABNORMAL HIGH (ref 70–99)
Potassium: 3.7 mmol/L (ref 3.5–5.2)
Sodium: 140 mmol/L (ref 134–144)
eGFR: 60 mL/min/1.73 (ref >59)

## 2025-01-02 ENCOUNTER — Encounter (HOSPITAL_BASED_OUTPATIENT_CLINIC_OR_DEPARTMENT_OTHER): Payer: Self-pay | Admitting: Cardiology

## 2025-01-02 ENCOUNTER — Ambulatory Visit (HOSPITAL_BASED_OUTPATIENT_CLINIC_OR_DEPARTMENT_OTHER): Admitting: Cardiology

## 2025-01-02 VITALS — BP 120/72 | HR 72 | Ht 66.0 in | Wt 204.4 lb

## 2025-01-02 DIAGNOSIS — I2510 Atherosclerotic heart disease of native coronary artery without angina pectoris: Secondary | ICD-10-CM

## 2025-01-02 DIAGNOSIS — I10 Essential (primary) hypertension: Secondary | ICD-10-CM

## 2025-01-02 DIAGNOSIS — D6869 Other thrombophilia: Secondary | ICD-10-CM

## 2025-01-02 DIAGNOSIS — I480 Paroxysmal atrial fibrillation: Secondary | ICD-10-CM | POA: Diagnosis not present

## 2025-01-02 DIAGNOSIS — I428 Other cardiomyopathies: Secondary | ICD-10-CM

## 2025-01-02 DIAGNOSIS — I5042 Chronic combined systolic (congestive) and diastolic (congestive) heart failure: Secondary | ICD-10-CM | POA: Diagnosis not present

## 2025-01-02 DIAGNOSIS — Z7901 Long term (current) use of anticoagulants: Secondary | ICD-10-CM | POA: Diagnosis not present

## 2025-01-02 DIAGNOSIS — I447 Left bundle-branch block, unspecified: Secondary | ICD-10-CM | POA: Diagnosis not present

## 2025-01-02 NOTE — Patient Instructions (Signed)
 Medication Instructions:   Your physician recommends that you continue on your current medications as directed. Please refer to the Current Medication list given to you today.  *If you need a refill on your cardiac medications before your next appointment, please call your pharmacy*  You have been referred to [facility].E.P--See handouts provided to you today about this program   Follow-Up: At [facility], you and your health needs are our priority.  As part of our continuing mission to provide you with exceptional heart care, our providers are all part of one team.  This team includes your primary Cardiologist (physician) and Advanced Practice Providers or APPs (Physician Assistants and Nurse Practitioners) who all work together to provide you with the care you need, when you need it.  Your next appointment:   3 month(s)  Provider:   Shelda Bruckner, MD or Reche Finder, NP     Other Instructions Think about if you want to do either cardiac rehab or the [facility] PREP program and let us  know, we are happy to refer.            Think about if you want to do either cardiac rehab or the [facility] PREP program and let us  know, we are happy to refer.

## 2025-01-02 NOTE — Progress Notes (Signed)
 " Cardiology Office Note:  .   Date:  01/02/2025  ID:  Evelyn Phelps, DOB 26-Jan-1951, MRN [id number] PCP: Patient, No Pcp Per  [facility] Providers Cardiologist:  Shelda Bruckner, MD Cardiology APP:  Vannie Reche RAMAN, NP {  History of Present Illness: .   Evelyn Phelps is a 74 y.o. female with a hx of chronic systolic and diastolic heart failure, nonischemic cardiomyopathy, LBBB, paroxysmal atrial fibrillation who is seen in follow up. I met her during her hospitalization 09/2019.   Cardiac history: new diagnosis of acute combined systolic and diastolic heart failure, afib RVR during admission 09/2019.  She had TEE-CV during that admission. On TOC follow up was back in atrial fibrillation. S/P repeat cardioversion, maintaining sinus rhythm though EF remains reduced. Cath 10/01/2019 with minimal CAD (20% prox RCA) and severe LV dysfunction.  Most recent echo 07/2024 was 30-35%, global hypokinesis  Today: She has read about ICD and CRT and does not wish to pursue after reading about them. We discussed extensively today, reviewed diagrams and images of this, discussed how ICD and CRT have different goals.  Asking about long term disability placard, reviewed criteria. Also discussed other options for symptom management including cardiac rehab. Discussed goal is to get better.   Not having much LE edema. Takes lasix  most days (unless she is out of the house or it is later in the day), has not required extra in a long time. Uses pedaler at home. Able to do all her usual activities, knows that she cannot climb stairs or walk for a long time. Can do housework but otherwise does not push herself.  ROS: Denies chest pain, shortness of breath at rest. No PND, orthopnea, change in LE edema or unexpected weight gain. No syncope or palpitations. ROS otherwise negative except as noted.   Studies Reviewed: SABRA    EKG:       Physical Exam:   VS:  BP 120/72   Pulse 72   Ht 5' 6 (1.676 m)    Wt 204 lb 6.4 oz (92.7 kg)   SpO2 95%   BMI 32.99 kg/m    Wt Readings from Last 3 Encounters:  01/02/25 204 lb 6.4 oz (92.7 kg)  09/30/24 205 lb 12.8 oz (93.4 kg)  07/01/24 221 lb (100.2 kg)    GEN: Well nourished, well developed in no acute distress HEENT: Normal, moist mucous membranes NECK: No JVD CARDIAC: regular rhythm, normal S1 and S2, no rubs or gallops. No murmur. VASCULAR: Radial and DP pulses 2+ bilaterally. No carotid bruits RESPIRATORY:  Clear to auscultation without rales, wheezing or rhonchi  ABDOMEN: Soft, non-tender, non-distended MUSCULOSKELETAL:  Ambulates independently SKIN: Warm and dry, no edema NEUROLOGIC:  Alert and oriented x 3. No focal neuro deficits noted. PSYCHIATRIC:  Normal affect    ASSESSMENT AND PLAN: .    Nonischemic cardiomyopathy, with chronic systolic and diastolic heart failure LBBB -echo has been very slow to improve despite NSR. Initially thought to be tachycardia induced, may have another component of NICM -NYHA class 2 today -continue carvedilol  12.5 mg BID -continue entresto  97-103 mg BID -continue Jardiance  10 mg daily -continue spironolactone  12.5 mg daily -using lasix  most days but has not required extra doses -Previously, her EF was above ICD level, and she was having fewer symptoms. We had discuss that she may need CRT in the future given LBBB. Reviewed again today. Given EF 30-35%, LBBB, symptoms, appropriate to refer to EP to discuss CRT +/- ICD.  I discussed with her at length today. She does not want to pursue ICD or CRT at this time -we discussed criteria for permanent handicap placard from a cardiovascular standpoint is being unable to walk less than 200 ft or class 3-4 symptoms long term. She feels that having this has helped her significantly. We discussed that even if she does not meet cardiac criteria, she can discuss with her PCP if she meets other criteria for this. I will give her a short term placard, but we discussed that  goal is to improve her activity, not limit it, see below. -discussed cardiac rehab as an option to improve her symptoms. She does not like driving to [place] near the hospital. Also offered the PREP program at the [facility]. I offered referrals to both today. She will think about this and let us  know what she decides.  Nonobstructive CAD -she states that she will never take a statin as she feels this contributed to her father's worsening health. Last lipids reviewed. -no aspirin  as she is on apixaban    Hypertension: at goal on medications for HF as above   paroxysmal atrial fibrillation Secondary hypercoagulable state -continue apixaban  5 mg BID CHA2DS2/VAS Stroke Risk Points = 5 -has maintained SR  CV risk counseling and prevention -recommend heart healthy/Mediterranean diet, with whole grains, fruits, vegetable, fish, lean meats, nuts, and olive oil. Limit salt. -recommend moderate walking, 3-5 times/week for 30-50 minutes each session. Aim for at least 150 minutes/week. Goal should be pace of 3 miles/hours, or walking 1.5 miles in 30 minutes -recommend avoidance of tobacco products. Avoid excess alcohol.  Dispo: 3 mos or sooner as needed  Total time of encounter: I spent 44 minutes dedicated to the care of this patient on the date of this encounter to include pre-visit review of records, face-to-face time with the patient discussing conditions above, and clinical documentation with the electronic health record. We specifically spent time today discussing CRT, ICD, goals for management, aiming to increase activity and options for doing this.   Signed, Shelda Bruckner, MD   Shelda Bruckner, MD, PhD, [facility] [place]  [facility]  [facility] at [facility] [address], [address] [place], [place] [address] [phone]   "

## 2025-01-05 ENCOUNTER — Encounter (HOSPITAL_BASED_OUTPATIENT_CLINIC_OR_DEPARTMENT_OTHER): Payer: Self-pay | Admitting: Cardiology

## 2025-01-05 ENCOUNTER — Telehealth (HOSPITAL_BASED_OUTPATIENT_CLINIC_OR_DEPARTMENT_OTHER): Payer: Self-pay | Admitting: *Deleted

## 2025-01-05 NOTE — Telephone Encounter (Signed)
-----   Message -----  From: Kiki Adonna NOVAK, RN  Sent: 01/05/2025   3:28 PM EST  To: Porter CHRISTELLA Lunger, LPN; Suzen Ash; Jerona DEL*  Subject: RE: COLLETTA Porter,  This patient doesn't have a PCP listed on her profile. If she does see one in the [facility] system, she would be eligible, but she either needs to have that person listed as her PCP in the EPIC MR or have her [facility] PCP submit the referral.  Welcome to call me if needed to discuss.  Thanks!  Adonna  [phone]  ----- Message -----  From: Mancil Doffing  Sent: 01/05/2025  11:11 AM EST  To: Porter CHRISTELLA Lunger, LPN; Adonna NOVAK Kiki, RN; Carson*  Subject: PREP                                           Erskin Porter,  Thank you for this referral.  Unfortunately, our [facility] Physician Referral Exercise Program will not be able to assist you with this patient. The patient is not attributed to [facility] after reviewing all of our current member enrollment rosters for any of the [facility] contracted plans.  The payer plans send us  this roster regularly and we have checked the list for this member. Also the patient doesn't have a [facility] Primary Care Provider.   Doffing Mancil, BLANCH CAULK  Lindsay  [facility], [facility] Health  Care Management Assistant  [phone]

## 2025-01-05 NOTE — Telephone Encounter (Signed)
 Called patient to ask about her PCP.  Introduced myself and adv calling as follow up to and have question re: prep program.  She told me he's in the grocery store check out and I'd need to call her back.  When I adv I only needed the name of her PCP so that she can get enrolled, she stated oh well I don't have one so I guess I'm out of luck.  The patient ended the call at that time.

## 2025-01-07 ENCOUNTER — Telehealth: Payer: Self-pay

## 2025-01-07 NOTE — Telephone Encounter (Signed)
 Called [facility] program, explained PREP ; she would like to attend, but is concerned about parking; advised that class is at 2pm which is a much less busier time; she would like to attend next class at [facility] on 01/20/25 every T/Th at 2pm; assessment visit scheduled for 1/28 at 2pm.

## 2025-01-11 ENCOUNTER — Other Ambulatory Visit (HOSPITAL_BASED_OUTPATIENT_CLINIC_OR_DEPARTMENT_OTHER): Payer: Self-pay | Admitting: Family

## 2025-01-11 DIAGNOSIS — I10 Essential (primary) hypertension: Secondary | ICD-10-CM

## 2025-01-11 DIAGNOSIS — I5042 Chronic combined systolic (congestive) and diastolic (congestive) heart failure: Secondary | ICD-10-CM

## 2025-01-11 DIAGNOSIS — I420 Dilated cardiomyopathy: Secondary | ICD-10-CM

## 2025-01-15 NOTE — Progress Notes (Signed)
 [facility] PREP Evaluation  Patient Details  Name: Evelyn Phelps MRN: [id number] Date of Birth: [birth year] Age: 74 y.o. PCP: Patient, No Pcp Per  Vitals:   01/15/25 1437  BP: 132/74  Pulse: 76  SpO2: 97%  Weight: 203 lb 3.2 oz (92.2 kg)     [facility] Eval - 01/15/25 1400       [facility] PREP Location   [facility] PREP Location [facility]      Referral    Referring Provider Lonni    Reason for referral Hypertension;Inactivity;Obesitity/Overweight;Heart Failure    Program Start Date 01/20/25      Measurement   Waist Circumference 44 inches    Hip Circumference 52.5 inches    Body fat 44.8 percent      Information for Trainer   Goals --   Increase Stamina   Pertinent Medical History --   CHF, Afib, cardiomyopathy, HTN   Medications that affect exercise Beta blocker      Timed Up and Go (TUGS)   Timed Up and Go Low risk <9 seconds      Mobility and Daily Activities   I find it easy to walk up or down two or more flights of stairs. 1    I have no trouble taking out the trash. 4    I do housework such as vacuuming and dusting on my own without difficulty. 4    I can easily lift a gallon of milk (8lbs). 4    I can easily walk a mile. 1    I have no trouble reaching into high cupboards or reaching down to pick up something from the floor. 3    I do not have trouble doing out-door work such as loss adjuster, chartered, raking leaves, or gardening. 3      Mobility and Daily Activities   I feel younger than my age. 2    I feel independent. 4    I feel energetic. 2    I live an active life.  2    I feel strong. 2    I feel healthy. 2    I feel active as other people my age. 1      How fit and strong are you.   Fit and Strong Total Score 35         Past Medical History:  Diagnosis Date   Atrial fibrillation with RVR (HCC)    Dilated cardiomyopathy (HCC)    Former tobacco use    Quit in 2018   Persistent atrial fibrillation (HCC)    Shingles 08/2024   Past Surgical  History:  Procedure Laterality Date   CARDIOVERSION N/A 10/03/2019   Procedure: CARDIOVERSION;  Surgeon: Kate Lonni CROME, MD;  Location: [facility] ENDOSCOPY;  Service: Endoscopy;  Laterality: N/A;   CARDIOVERSION N/A 02/03/2020   Procedure: CARDIOVERSION;  Surgeon: Lonni Slain, MD;  Location: [facility] ENDOSCOPY;  Service: Cardiovascular;  Laterality: N/A;   LEFT HEART CATH AND CORONARY ANGIOGRAPHY N/A 10/01/2019   Procedure: LEFT HEART CATH AND CORONARY ANGIOGRAPHY;  Surgeon: Darron Deatrice LABOR, MD;  Location: [facility] INVASIVE CV LAB;  Service: Cardiovascular;  Laterality: N/A;   TEE WITHOUT CARDIOVERSION N/A 10/03/2019   Procedure: TRANSESOPHAGEAL ECHOCARDIOGRAM (TEE);  Surgeon: Kate Lonni CROME, MD;  Location: [facility] ENDOSCOPY;  Service: Endoscopy;  Laterality: N/A;   Tobacco Use History[1] To begin PREP class at [facility] on 01/20/25, every T/Th at 2pm Noriel Guthrie B Yarithza Mink 01/15/2025, 2:40 PM      [1]  Social History  Tobacco Use  Smoking Status Former   Current packs/day: 0.00   Types: Cigarettes   Quit date: 09/28/2017   Years since quitting: 7.3  Smokeless Tobacco Never

## 2025-01-20 NOTE — Progress Notes (Signed)
 [facility] PREP Weekly Session  Patient Details  Name: Evelyn Phelps MRN: [id number] Date of Birth: [birth year] Age: 74 y.o. PCP: Patient, No Pcp Per  There were no vitals filed for this visit.   [facility] Weekly seesion - 01/20/25 1500       [facility] PREP Location   [facility] PREP Location [facility]      Weekly Session   Topic Discussed Goal setting and welcome to the program   Introductions, review of notebook, tour of facility; offered option to work out on cardio machine   Classes attended to date 1          [name] 01/20/2025, 3:31 PM

## 2025-04-21 ENCOUNTER — Ambulatory Visit (HOSPITAL_BASED_OUTPATIENT_CLINIC_OR_DEPARTMENT_OTHER): Admitting: Family
